# Patient Record
Sex: Male | Born: 1955 | Race: White | Hispanic: No | Smoking: Former smoker
Health system: Southern US, Community
[De-identification: ages and names within clinical notes are randomized; demographics above are authoritative.]

## PROBLEM LIST (undated history)

## (undated) DIAGNOSIS — G5602 Carpal tunnel syndrome, left upper limb: Secondary | ICD-10-CM

## (undated) DIAGNOSIS — E119 Type 2 diabetes mellitus without complications: Secondary | ICD-10-CM

## (undated) DIAGNOSIS — M67912 Unspecified disorder of synovium and tendon, left shoulder: Secondary | ICD-10-CM

## (undated) DIAGNOSIS — I255 Ischemic cardiomyopathy: Secondary | ICD-10-CM

## (undated) DIAGNOSIS — E669 Obesity, unspecified: Secondary | ICD-10-CM

## (undated) DIAGNOSIS — I2510 Atherosclerotic heart disease of native coronary artery without angina pectoris: Secondary | ICD-10-CM

## (undated) DIAGNOSIS — I252 Old myocardial infarction: Secondary | ICD-10-CM

## (undated) DIAGNOSIS — Z79899 Other long term (current) drug therapy: Secondary | ICD-10-CM

## (undated) DIAGNOSIS — M47812 Spondylosis without myelopathy or radiculopathy, cervical region: Secondary | ICD-10-CM

## (undated) DIAGNOSIS — Z9862 Peripheral vascular angioplasty status: Secondary | ICD-10-CM

## (undated) DIAGNOSIS — J439 Emphysema, unspecified: Secondary | ICD-10-CM

## (undated) DIAGNOSIS — K259 Gastric ulcer, unspecified as acute or chronic, without hemorrhage or perforation: Secondary | ICD-10-CM

## (undated) DIAGNOSIS — I10 Essential (primary) hypertension: Secondary | ICD-10-CM

## (undated) DIAGNOSIS — E1142 Type 2 diabetes mellitus with diabetic polyneuropathy: Secondary | ICD-10-CM

## (undated) DIAGNOSIS — Z7982 Long term (current) use of aspirin: Secondary | ICD-10-CM

## (undated) DIAGNOSIS — E782 Mixed hyperlipidemia: Secondary | ICD-10-CM

## (undated) DIAGNOSIS — G4730 Sleep apnea, unspecified: Secondary | ICD-10-CM

## (undated) DIAGNOSIS — E1340 Other specified diabetes mellitus with diabetic neuropathy, unspecified: Secondary | ICD-10-CM

## (undated) DIAGNOSIS — I509 Heart failure, unspecified: Secondary | ICD-10-CM

## (undated) DIAGNOSIS — Z955 Presence of coronary angioplasty implant and graft: Secondary | ICD-10-CM

## (undated) DIAGNOSIS — E13319 Other specified diabetes mellitus with unspecified diabetic retinopathy without macular edema: Secondary | ICD-10-CM

## (undated) DIAGNOSIS — G4733 Obstructive sleep apnea (adult) (pediatric): Secondary | ICD-10-CM

## (undated) DIAGNOSIS — G5600 Carpal tunnel syndrome, unspecified upper limb: Secondary | ICD-10-CM

## (undated) DIAGNOSIS — I700 Atherosclerosis of aorta: Secondary | ICD-10-CM

## (undated) DIAGNOSIS — M7532 Calcific tendinitis of left shoulder: Secondary | ICD-10-CM

## (undated) DIAGNOSIS — J342 Deviated nasal septum: Secondary | ICD-10-CM

## (undated) DIAGNOSIS — K219 Gastro-esophageal reflux disease without esophagitis: Secondary | ICD-10-CM

## (undated) DIAGNOSIS — E7800 Pure hypercholesterolemia, unspecified: Secondary | ICD-10-CM

## (undated) DIAGNOSIS — E785 Hyperlipidemia, unspecified: Secondary | ICD-10-CM

## (undated) DIAGNOSIS — I6789 Other cerebrovascular disease: Secondary | ICD-10-CM

## (undated) DIAGNOSIS — M1990 Unspecified osteoarthritis, unspecified site: Secondary | ICD-10-CM

## (undated) HISTORY — PX: CORONARY ANGIOPLASTY: SHX604

## (undated) HISTORY — PX: CARDIAC SURGERY: SHX584

## (undated) HISTORY — PX: MULTIPLE TOOTH EXTRACTIONS: SHX2053

---

## 1998-07-27 ENCOUNTER — Ambulatory Visit: Admission: RE | Admit: 1998-07-27 | Discharge: 1998-07-27 | Payer: Self-pay | Admitting: Pulmonary Disease

## 2000-12-28 DIAGNOSIS — I2510 Atherosclerotic heart disease of native coronary artery without angina pectoris: Secondary | ICD-10-CM

## 2000-12-28 DIAGNOSIS — I219 Acute myocardial infarction, unspecified: Secondary | ICD-10-CM

## 2000-12-28 HISTORY — DX: Atherosclerotic heart disease of native coronary artery without angina pectoris: I25.10

## 2000-12-28 HISTORY — PX: CORONARY ANGIOPLASTY WITH STENT PLACEMENT: SHX49

## 2000-12-28 HISTORY — DX: Acute myocardial infarction, unspecified: I21.9

## 2001-03-15 ENCOUNTER — Inpatient Hospital Stay (HOSPITAL_COMMUNITY): Admission: AD | Admit: 2001-03-15 | Discharge: 2001-03-17 | Payer: Self-pay | Admitting: Cardiology

## 2001-08-12 ENCOUNTER — Inpatient Hospital Stay (HOSPITAL_COMMUNITY): Admission: AD | Admit: 2001-08-12 | Discharge: 2001-08-16 | Payer: Self-pay | Admitting: Cardiology

## 2001-08-14 ENCOUNTER — Encounter: Payer: Self-pay | Admitting: Cardiology

## 2013-03-13 ENCOUNTER — Encounter: Payer: Self-pay | Admitting: Pulmonary Disease

## 2015-11-28 DIAGNOSIS — Z955 Presence of coronary angioplasty implant and graft: Secondary | ICD-10-CM

## 2015-11-28 HISTORY — DX: Presence of coronary angioplasty implant and graft: Z95.5

## 2015-12-05 ENCOUNTER — Emergency Department: Payer: Self-pay

## 2015-12-05 ENCOUNTER — Inpatient Hospital Stay
Admission: EM | Admit: 2015-12-05 | Discharge: 2015-12-07 | DRG: 247 | Disposition: A | Discharge status: Home / Self Care | Payer: Self-pay | Attending: Internal Medicine | Admitting: Internal Medicine

## 2015-12-05 ENCOUNTER — Inpatient Hospital Stay
Admit: 2015-12-05 | Discharge: 2015-12-05 | Disposition: A | Discharge status: Home / Self Care | Payer: Self-pay | Attending: Internal Medicine | Admitting: Internal Medicine

## 2015-12-05 DIAGNOSIS — Z6835 Body mass index (BMI) 35.0-35.9, adult: Secondary | ICD-10-CM

## 2015-12-05 DIAGNOSIS — I214 Non-ST elevation (NSTEMI) myocardial infarction: Secondary | ICD-10-CM

## 2015-12-05 DIAGNOSIS — I252 Old myocardial infarction: Secondary | ICD-10-CM

## 2015-12-05 DIAGNOSIS — E785 Hyperlipidemia, unspecified: Secondary | ICD-10-CM | POA: Diagnosis present

## 2015-12-05 DIAGNOSIS — I200 Unstable angina: Secondary | ICD-10-CM | POA: Diagnosis present

## 2015-12-05 DIAGNOSIS — Z87891 Personal history of nicotine dependence: Secondary | ICD-10-CM

## 2015-12-05 DIAGNOSIS — T82855A Stenosis of coronary artery stent, initial encounter: Secondary | ICD-10-CM | POA: Diagnosis present

## 2015-12-05 DIAGNOSIS — E7800 Pure hypercholesterolemia, unspecified: Secondary | ICD-10-CM | POA: Diagnosis present

## 2015-12-05 DIAGNOSIS — I25110 Atherosclerotic heart disease of native coronary artery with unstable angina pectoris: Secondary | ICD-10-CM | POA: Diagnosis present

## 2015-12-05 DIAGNOSIS — Z79899 Other long term (current) drug therapy: Secondary | ICD-10-CM

## 2015-12-05 DIAGNOSIS — Z7984 Long term (current) use of oral hypoglycemic drugs: Secondary | ICD-10-CM

## 2015-12-05 DIAGNOSIS — Z7982 Long term (current) use of aspirin: Secondary | ICD-10-CM

## 2015-12-05 DIAGNOSIS — I10 Essential (primary) hypertension: Secondary | ICD-10-CM | POA: Diagnosis present

## 2015-12-05 DIAGNOSIS — E119 Type 2 diabetes mellitus without complications: Secondary | ICD-10-CM | POA: Diagnosis present

## 2015-12-05 DIAGNOSIS — Z888 Allergy status to other drugs, medicaments and biological substances status: Secondary | ICD-10-CM

## 2015-12-05 DIAGNOSIS — Y848 Other medical procedures as the cause of abnormal reaction of the patient, or of later complication, without mention of misadventure at the time of the procedure: Secondary | ICD-10-CM | POA: Diagnosis present

## 2015-12-05 HISTORY — DX: Old myocardial infarction: I25.2

## 2015-12-05 HISTORY — DX: Type 2 diabetes mellitus without complications: E11.9

## 2015-12-05 HISTORY — DX: Pure hypercholesterolemia, unspecified: E78.00

## 2015-12-05 HISTORY — DX: Essential (primary) hypertension: I10

## 2015-12-05 HISTORY — DX: Presence of coronary angioplasty implant and graft: Z95.5

## 2015-12-05 HISTORY — DX: Non-ST elevation (NSTEMI) myocardial infarction: I21.4

## 2015-12-05 LAB — CBC
HEMATOCRIT: 43.7 % (ref 40.0–52.0)
HEMOGLOBIN: 14.5 g/dL (ref 13.0–18.0)
MCH: 27.3 pg (ref 26.0–34.0)
MCHC: 33.3 g/dL (ref 32.0–36.0)
MCV: 82.1 fL (ref 80.0–100.0)
Platelets: 215 10*3/uL (ref 150–440)
RBC: 5.33 MIL/uL (ref 4.40–5.90)
RDW: 13.5 % (ref 11.5–14.5)
WBC: 5.2 10*3/uL (ref 3.8–10.6)

## 2015-12-05 LAB — BASIC METABOLIC PANEL
ANION GAP: 7 (ref 5–15)
BUN: 17 mg/dL (ref 6–20)
CHLORIDE: 104 mmol/L (ref 101–111)
CO2: 27 mmol/L (ref 22–32)
Calcium: 9.4 mg/dL (ref 8.9–10.3)
Creatinine, Ser: 1 mg/dL (ref 0.61–1.24)
GFR calc Af Amer: >60 mL/min (ref >60)
GLUCOSE: 229 mg/dL — AB (ref 65–99)
POTASSIUM: 4 mmol/L (ref 3.5–5.1)
Sodium: 138 mmol/L (ref 135–145)

## 2015-12-05 LAB — TROPONIN I
TROPONIN I: 0.28 ng/mL — AB (ref <0.031)
TROPONIN I: 0.78 ng/mL — AB (ref <0.031)
TROPONIN I: 1.01 ng/mL — AB (ref <0.031)
Troponin I: 0.06 ng/mL — ABNORMAL HIGH (ref <0.031)

## 2015-12-05 LAB — GLUCOSE, CAPILLARY
GLUCOSE-CAPILLARY: 177 mg/dL — AB (ref 65–99)
Glucose-Capillary: 182 mg/dL — ABNORMAL HIGH (ref 65–99)
Glucose-Capillary: 265 mg/dL — ABNORMAL HIGH (ref 65–99)

## 2015-12-05 MED ORDER — VITAMIN C 500 MG PO TABS
1000.0000 mg | ORAL_TABLET | Freq: Every day | ORAL | Status: DC
  Administered 2015-12-05 – 2015-12-07 (×3): 1000 mg via ORAL
  Filled 2015-12-05 (×3): qty 2

## 2015-12-05 MED ORDER — ACETAMINOPHEN 325 MG PO TABS
650.0000 mg | ORAL_TABLET | Freq: Four times a day (QID) | ORAL | Status: DC | PRN
  Administered 2015-12-06: 650 mg via ORAL
  Filled 2015-12-05: qty 2

## 2015-12-05 MED ORDER — MORPHINE SULFATE (PF) 2 MG/ML IV SOLN: 1.0000 mg | INTRAVENOUS | Status: DC | PRN

## 2015-12-05 MED ORDER — ENOXAPARIN SODIUM 150 MG/ML ~~LOC~~ SOLN
1.0000 mg/kg | Freq: Two times a day (BID) | SUBCUTANEOUS | Status: DC
Start: 2015-12-05 — End: 2015-12-05
  Filled 2015-12-05 (×2): qty 0.85

## 2015-12-05 MED ORDER — ENALAPRIL MALEATE 10 MG PO TABS
10.0000 mg | ORAL_TABLET | Freq: Every day | ORAL | Status: DC
  Administered 2015-12-05 – 2015-12-07 (×3): 10 mg via ORAL
  Filled 2015-12-05 (×3): qty 1

## 2015-12-05 MED ORDER — SODIUM CHLORIDE 0.9 % WEIGHT BASED INFUSION: 3.0000 mL/kg/h | INTRAVENOUS | Status: AC

## 2015-12-05 MED ORDER — SODIUM CHLORIDE 0.9 % WEIGHT BASED INFUSION
1.0000 mL/kg/h | INTRAVENOUS | Status: DC
  Administered 2015-12-06: 1 mL/kg/h via INTRAVENOUS

## 2015-12-05 MED ORDER — VITAMIN B-12 1000 MCG PO TABS
1000.0000 ug | ORAL_TABLET | Freq: Every day | ORAL | Status: DC
  Administered 2015-12-05 – 2015-12-07 (×3): 1000 ug via ORAL
  Filled 2015-12-05 (×3): qty 1

## 2015-12-05 MED ORDER — ONDANSETRON HCL 4 MG PO TABS: 4.0000 mg | ORAL_TABLET | Freq: Four times a day (QID) | ORAL | Status: DC | PRN

## 2015-12-05 MED ORDER — ASPIRIN EC 81 MG PO TBEC
81.0000 mg | DELAYED_RELEASE_TABLET | Freq: Every day | ORAL | Status: DC
  Administered 2015-12-05: 81 mg via ORAL
  Filled 2015-12-05: qty 1

## 2015-12-05 MED ORDER — OXYCODONE HCL 5 MG PO TABS: 5.0000 mg | ORAL_TABLET | ORAL | Status: DC | PRN

## 2015-12-05 MED ORDER — ACETAMINOPHEN 650 MG RE SUPP: 650.0000 mg | Freq: Four times a day (QID) | RECTAL | Status: DC | PRN

## 2015-12-05 MED ORDER — ENOXAPARIN SODIUM 150 MG/ML ~~LOC~~ SOLN
1.0000 mg/kg | Freq: Once | SUBCUTANEOUS | Status: AC
  Administered 2015-12-05: 130 mg via SUBCUTANEOUS
  Filled 2015-12-05: qty 0.85

## 2015-12-05 MED ORDER — SODIUM CHLORIDE 0.9 % IJ SOLN: 3.0000 mL | INTRAMUSCULAR | Status: DC | PRN

## 2015-12-05 MED ORDER — SODIUM CHLORIDE 0.9 % IV SOLN
INTRAVENOUS | Status: DC
  Administered 2015-12-05 – 2015-12-07 (×5): via INTRAVENOUS

## 2015-12-05 MED ORDER — VITAMIN E 180 MG (400 UNIT) PO CAPS
800.0000 [IU] | ORAL_CAPSULE | Freq: Every day | ORAL | Status: DC
  Administered 2015-12-05 – 2015-12-07 (×3): 800 [IU] via ORAL
  Filled 2015-12-05 (×3): qty 2

## 2015-12-05 MED ORDER — OMEGA-3-ACID ETHYL ESTERS 1 G PO CAPS
2.0000 g | ORAL_CAPSULE | Freq: Every day | ORAL | Status: DC
  Administered 2015-12-05 – 2015-12-07 (×3): 2 g via ORAL
  Filled 2015-12-05 (×3): qty 2

## 2015-12-05 MED ORDER — INSULIN ASPART 100 UNIT/ML ~~LOC~~ SOLN: 0.0000 [IU] | Freq: Every day | SUBCUTANEOUS | Status: DC

## 2015-12-05 MED ORDER — SODIUM CHLORIDE 0.9 % IV SOLN: 250.0000 mL | INTRAVENOUS | Status: DC | PRN

## 2015-12-05 MED ORDER — ASPIRIN 81 MG PO CHEW
81.0000 mg | CHEWABLE_TABLET | ORAL | Status: AC
  Administered 2015-12-06: 81 mg via ORAL
  Filled 2015-12-05: qty 1

## 2015-12-05 MED ORDER — ROSUVASTATIN CALCIUM 10 MG PO TABS
10.0000 mg | ORAL_TABLET | Freq: Every day | ORAL | Status: DC
  Administered 2015-12-05 – 2015-12-06 (×2): 10 mg via ORAL
  Filled 2015-12-05 (×3): qty 1

## 2015-12-05 MED ORDER — SODIUM CHLORIDE 0.9 % IJ SOLN: 3.0000 mL | Freq: Two times a day (BID) | INTRAMUSCULAR | Status: DC

## 2015-12-05 MED ORDER — INSULIN ASPART 100 UNIT/ML ~~LOC~~ SOLN
0.0000 [IU] | Freq: Three times a day (TID) | SUBCUTANEOUS | Status: DC
  Administered 2015-12-05: 8 [IU] via SUBCUTANEOUS
  Administered 2015-12-05: 3 [IU] via SUBCUTANEOUS
  Filled 2015-12-05: qty 3
  Filled 2015-12-05: qty 8

## 2015-12-05 MED ORDER — ENOXAPARIN SODIUM 120 MG/0.8ML ~~LOC~~ SOLN
1.0000 mg/kg | Freq: Two times a day (BID) | SUBCUTANEOUS | Status: DC
  Administered 2015-12-05 (×2): 120 mg via SUBCUTANEOUS
  Filled 2015-12-05 (×4): qty 0.8

## 2015-12-05 MED ORDER — INSULIN ASPART 100 UNIT/ML ~~LOC~~ SOLN
0.0000 [IU] | Freq: Three times a day (TID) | SUBCUTANEOUS | Status: DC
  Administered 2015-12-05: 2 [IU] via SUBCUTANEOUS
  Administered 2015-12-06: 3 [IU] via SUBCUTANEOUS
  Administered 2015-12-06: 5 [IU] via SUBCUTANEOUS
  Administered 2015-12-07: 3 [IU] via SUBCUTANEOUS
  Filled 2015-12-05: qty 3
  Filled 2015-12-05: qty 2
  Filled 2015-12-05: qty 3
  Filled 2015-12-05: qty 5
  Filled 2015-12-05: qty 3

## 2015-12-05 MED ORDER — ONDANSETRON HCL 4 MG/2ML IJ SOLN: 4.0000 mg | Freq: Four times a day (QID) | INTRAMUSCULAR | Status: DC | PRN

## 2015-12-05 MED ORDER — ATENOLOL 25 MG PO TABS
50.0000 mg | ORAL_TABLET | Freq: Every day | ORAL | Status: DC
  Administered 2015-12-05 – 2015-12-07 (×3): 50 mg via ORAL
  Filled 2015-12-05 (×3): qty 2

## 2015-12-05 MED ORDER — ADULT MULTIVITAMIN W/MINERALS CH
1.0000 | ORAL_TABLET | Freq: Every day | ORAL | Status: DC
  Administered 2015-12-05 – 2015-12-07 (×3): 1 via ORAL
  Filled 2015-12-05 (×3): qty 1

## 2015-12-05 NOTE — Consult Note (Signed)
[facility] Cardiology  CARDIOLOGY CONSULT NOTE  Patient ID: Marc Blair MRN: [id number] DOB/AGE: 61/04/1956 61 y.o.  Admit date: 12/05/2015 Referring Physician Allena KatzPatel Primary Physician  Primary Cardiologist  Reason for Consultation unstable angina  HPI: The patient is a 59 year old gentleman with known coronary artery disease referred for evaluation of ST pain. Patient is status post [facility]. Patient has been likely stable on Toprol this morning when [facility] with prolonged episode of chest pain and elevated blood pressure. Initial EKG was nondiagnostic. She was treated with aspirin and Nitropaste resolution of chest pain. Initial troponin was borderline elevated 0.06. Patient had no recurrent chest pain following admission to telemetry floor.  Review of systems complete and found to be negative unless listed above     Past Medical History  Diagnosis Date  . Hypertension   . Diabetes mellitus without complication (HCC)   . High cholesterol   . MI, old     2002    Past Surgical History  Procedure Laterality Date  . Cardiac surgery      Prescriptions prior to admission  Medication Sig Dispense Refill Last Dose  . ascorbic acid (VITAMIN C) 1000 MG tablet Take 1,000 mg by mouth daily.   12/04/2015 at Unknown time  . aspirin EC 81 MG tablet Take 81 mg by mouth daily.   12/05/2015 at Unknown time  . atenolol (TENORMIN) 50 MG tablet Take 50 mg by mouth daily.   12/04/2015 at Unknown time  . Bee Pollen 1000 MG TABS Take 1,000 mg by mouth daily.   12/04/2015 at Unknown time  . enalapril (VASOTEC) 10 MG tablet Take 10 mg by mouth daily.   12/04/2015 at Unknown time  . metFORMIN (GLUCOPHAGE) 1000 MG tablet Take 1,000 mg by mouth 2 (two) times daily with a meal.   12/04/2015 at Unknown time  . Multiple Vitamin (MULTIVITAMIN WITH MINERALS) TABS tablet Take 1 tablet by mouth daily.   12/04/2015 at Unknown time  . Omega-3 Fatty Acids (FISH OIL)  1000 MG CAPS Take 2,000 mg by mouth daily.   12/04/2015 at Unknown time  . rosuvastatin (CRESTOR) 10 MG tablet Take 10 mg by mouth daily.   12/04/2015 at Unknown time  . rosuvastatin (CRESTOR) 20 MG tablet Take 20 mg by mouth daily.   12/04/2015 at Unknown time  . sitaGLIPtin (JANUVIA) 100 MG tablet Take 100 mg by mouth daily.   12/04/2015 at Unknown time  . vitamin B-12 (CYANOCOBALAMIN) 1000 MCG tablet Take 1,000 mcg by mouth daily.   12/04/2015 at Unknown time  . vitamin E (VITAMIN E) 400 UNIT capsule Take 800 Units by mouth daily.   12/04/2015 at Unknown time   Social History   Social History  . Marital Status: Married    Spouse Name: N/A  . Number of Children: N/A  . Years of Education: N/A   Occupational History  . Not on file.   Social History Main Topics  . Smoking status: Former Smoker    Quit date: 04/05/2015  . Smokeless tobacco: Not on file  . Alcohol Use: Not on file  . Drug Use: Not on file  . Sexual Activity: Not on file   Other Topics Concern  . Not on file   Social History Narrative  . No narrative on file    History reviewed. No pertinent family history.    Review of systems complete and found to be negative unless listed above  PHYSICAL EXAM  General: Well developed, well nourished, in no acute distress HEENT:  Normocephalic and atramatic Neck:  No JVD.  Lungs: Clear bilaterally to auscultation and percussion. Heart: HRRR . Normal S1 and S2 without gallops or murmurs.  Abdomen: Bowel sounds are positive, abdomen soft and non-tender  Msk:  Back normal, normal gait. Normal strength and tone for age. Extremities: No clubbing, cyanosis or edema.   Neuro: Alert and oriented X 3. Psych:  Good affect, responds appropriately  Labs:   Lab Results  Component Value Date   WBC 5.2 12/05/2015   HGB 14.5 12/05/2015   HCT 43.7 12/05/2015   MCV 82.1 12/05/2015   PLT 215 12/05/2015    Recent Labs Lab 12/05/15 0251  NA 138  K 4.0  CL 104  CO2 27   BUN 17  CREATININE 1.00  CALCIUM 9.4  GLUCOSE 229*   Lab Results  Component Value Date   TROPONINI 0.28* 12/05/2015   No results found for: CHOL No results found for: HDL No results found for: LDLCALC No results found for: TRIG No results found for: CHOLHDL No results found for: LDLDIRECT    Radiology: Dg Chest Port 1 View  12/05/2015  CLINICAL DATA:  Acute onset of mid chest pain, radiating to the left, and down the left arm. Initial encounter. EXAM: PORTABLE CHEST 1 VIEW COMPARISON:  None. FINDINGS: The lungs are well-aerated. Minimal bibasilar atelectasis is noted. There is no evidence of pleural effusion or pneumothorax. The cardiomediastinal silhouette is borderline normal in size. No acute osseous abnormalities are seen. There is an accessory articulation between the right second and third ribs. IMPRESSION: Minimal bibasilar atelectasis noted.  Lungs otherwise clear. Electronically Signed   By: Roanna Raider M.D.   On: 12/05/2015 03:35    EKG: Normal sinus rhythm  ASSESSMENT AND PLAN:   1. Unstable angina versus non-ST elevation myocardial infarction. The patient has known coronary artery disease status post prior coronary stents, currently chest pain-free  Recommendations  1. Agree with current therapy 2. Cardiac catheterization with selective coronary arteriography scheduled for 11/06/2015. The risks, benefits alternatives to cardiac catheterization were explained to the patient and informed written consent was obtained.  SignedMarcina Millard MD,PhD, [facility] 12/05/2015, 9:29 AM

## 2015-12-05 NOTE — ED Notes (Addendum)
Pt bib EMS w/ c/o CP that started tonight.  Pt sts pain feels like pressure in L chest that radiates to L arm. Pt denies n/v/d, LOC, dizziness. Pt sts he is SOB.  Pt A/Ox4.  Pt took 3 ASA at home this AM.  Per EMS PT received 324 mg ASA via EMS and 1" nitro paste.

## 2015-12-05 NOTE — Progress Notes (Signed)
[facility] [place] [facility]   PATIENT NAME: Marc Blair    MR#:  [id number]  DATE OF BIRTH:  12/31/1955  SUBJECTIVE:  CHIEF COMPLAINT:  Patient is reporting  dull chest discomfort on the left side of the chest radiating to the shoulder. Denies any shortness of breath. No other complaints, sister at bedside  REVIEW OF SYSTEMS:  CONSTITUTIONAL: No fever, fatigue or weakness.  EYES: No blurred or double vision.  EARS, NOSE, AND THROAT: No tinnitus or ear pain.  RESPIRATORY: No cough, shortness of breath, wheezing or hemoptysis.  CARDIOVASCULAR: Reporting dull chest pain on the left side of the chest, denies orthopnea, edema.  GASTROINTESTINAL: No nausea, vomiting, diarrhea or abdominal pain.  GENITOURINARY: No dysuria, hematuria.  ENDOCRINE: No polyuria, nocturia,  HEMATOLOGY: No anemia, easy bruising or bleeding SKIN: No rash or lesion. MUSCULOSKELETAL: No joint pain or arthritis.   NEUROLOGIC: No tingling, numbness, weakness.  PSYCHIATRY: No anxiety or depression.   DRUG ALLERGIES:   Allergies  Allergen Reactions  . Lipitor [Atorvastatin] Other (See Comments)    "FEELS LIKE SOMEONE BEAT THE DAYLIGHTS OUT OF ME"    VITALS:  Blood pressure 128/80, pulse 64, temperature 97.7 F (36.5 C), temperature source Oral, resp. rate 16, height 6' (1.829 m), weight 119.251 kg (262 lb 14.4 oz), SpO2 95 %.  PHYSICAL EXAMINATION:  GENERAL:  59 y.o.-year-old patient lying in the bed with no acute distress.  EYES: Pupils equal, round, reactive to light and accommodation. No scleral icterus. Extraocular muscles intact.  HEENT: Head atraumatic, normocephalic. Oropharynx and nasopharynx clear.  NECK:  Supple, no jugular venous distention. No thyroid enlargement, no tenderness.  LUNGS: Normal breath sounds bilaterally, no wheezing, rales,rhonchi or crepitation. No use of accessory muscles of respiration.  CARDIOVASCULAR: S1, S2 normal. No murmurs, rubs, or  gallops. No reproducible anterior chest wall tenderness ABDOMEN: Soft, nontender, nondistended. Bowel sounds present. No organomegaly or mass.  EXTREMITIES: No pedal edema, cyanosis, or clubbing.  NEUROLOGIC: Cranial nerves II through XII are intact. Muscle strength 5/5 in all extremities. Sensation intact. Gait not checked.  PSYCHIATRIC: The patient is alert and oriented x 3.  SKIN: No obvious rash, lesion, or ulcer.    LABORATORY PANEL:   CBC  Recent Labs Lab 12/05/15 0251  WBC 5.2  HGB 14.5  HCT 43.7  PLT 215   ------------------------------------------------------------------------------------------------------------------  Chemistries   Recent Labs Lab 12/05/15 0251  NA 138  K 4.0  CL 104  CO2 27  GLUCOSE 229*  BUN 17  CREATININE 1.00  CALCIUM 9.4   ------------------------------------------------------------------------------------------------------------------  Cardiac Enzymes  Recent Labs Lab 12/05/15 1212  TROPONINI 0.78*   ------------------------------------------------------------------------------------------------------------------  RADIOLOGY:  Dg Chest Port 1 View  12/05/2015  CLINICAL DATA:  Acute onset of mid chest pain, radiating to the left, and down the left arm. Initial encounter. EXAM: PORTABLE CHEST 1 VIEW COMPARISON:  None. FINDINGS: The lungs are well-aerated. Minimal bibasilar atelectasis is noted. There is no evidence of pleural effusion or pneumothorax. The cardiomediastinal silhouette is borderline normal in size. No acute osseous abnormalities are seen. There is an accessory articulation between the right second and third ribs. IMPRESSION: Minimal bibasilar atelectasis noted.  Lungs otherwise clear. Electronically Signed   By: Roanna Raider M.D.   On: 12/05/2015 03:35    EKG:   Orders placed or performed during the hospital encounter of 12/05/15  . ED EKG  . ED EKG    ASSESSMENT AND PLAN:   [name] is  a 59 y.o. male  with a known history of CAD status post stent 2 in 2002, diabetes, hypertension, morbid obesity and hyperlipidemia comes to the emergency room after patient got home from work at midnight had a snack to eat thereafter he started having symptoms of indigestion feeling along with bilateral shoulder pain more so on the left radiating to the left arm.  1. Non-STEMI in the setting of coronary artery disease status post stenting 2 in 2002 with multiple risk factors of morbid obesity hypertension and diabetes Troponins are 0.06-0. 28-0.78 Patient is getting Lovenox 1 mg/kg subcutaneous twice a day Scheduled to get cardiac catheter in a.m. Will keep her nothing by mouth after midnight Appreciate cardiology recommendations Continue aspirin, nitroglycerin, beta blockers, ace inhibitors and statins  2. Type 2 diabetes Continue home meds except metformin since patient may require cardiac catheterization Sliding-scale insulin  3. Hypertension Continue Ace inhibitors and beta blockers  4. Hyperlipidemia on statins  5. DVT prophylaxis already on Lovenox treatment dose.     All the records are reviewed and case discussed with Care Management/Social Workerr. Management plans discussed with the patient, family and they are in agreement.  CODE STATUS: Full code  TOTAL CRITICAL CARE TIME TAKING CARE OF THIS PATIENT: 35 minutes.   POSSIBLE D/C IN 1-2DAYS, DEPENDING ON CLINICAL CONDITION.   Ramonita LabGouru, Lorraine Cimmino M.D on 12/05/2015 at 2:53 PM  Between 7am to 6pm - Pager - [phone] After 6pm go to [email or web address] - password [email or web address] [facility]  [facility]  Office  [phone]  CC: Primary care physician; No primary care provider on file.

## 2015-12-05 NOTE — Plan of Care (Signed)
Problem: Pain Managment: Goal: General experience of comfort will improve Outcome: Progressing Prn meds  Problem: Tissue Perfusion: Goal: Risk factors for ineffective tissue perfusion will decrease Outcome: Progressing SQ Lovenox     Problem: Consults Goal: Diabetes Guidelines if Diabetic/Glucose > 140 If diabetic or lab glucose is > 140 mg/dl - Initiate Diabetes/Hyperglycemia Guidelines & Document Interventions  Outcome: Progressing SSI in place

## 2015-12-05 NOTE — Progress Notes (Signed)
Lovenox dose note:  59 yo male on Lovenox 1 mg/kg(130 mg) q12h for unstable angina,CAD, stents, cardiology consult.  Initial weight= 128 kg Current weight 12/8 at 0613= 119 kg  Will adjust Lovenox dosing to Lovenox 1mg /kg (120 mg) Q12h due to change in weight.  Bari MantisKristin Sahid Borba PharmD Clinical Pharmacist 12/05/2015 8:14 AM

## 2015-12-05 NOTE — Progress Notes (Signed)
Marc Blair notified of patient's fourth Troponin being elevated/ trending up @ 1.01, patient on Lovenox and scheduled for cardiac cath tomorrow. MD stated she would consult with pharmacy about adjusting anticoag medications. Awaiting to see if changes will be made. MD to enter orders, if adjusting.

## 2015-12-05 NOTE — Progress Notes (Signed)
*  PRELIMINARY RESULTS* Echocardiogram 2D Echocardiogram has been performed.  Garrel Ridgelikeshia S Stills 12/05/2015, 4:48 PM

## 2015-12-05 NOTE — H&P (Signed)
[facility]   PATIENT NAME: Marc Blair    MR#:  [id number]  DATE OF BIRTH:  July 10, 1956  DATE OF ADMISSION:  12/05/2015  PRIMARY CARE PHYSICIAN: No primary care provider on file.   REQUESTING/REFERRING PHYSICIAN: Dr. Manson Passey  CHIEF COMPLAINT:  Bilateral shoulder pain and chest pain radiating to the left arm  HISTORY OF PRESENT ILLNESS:  Marc Blair  is a 59 y.o. male with a known history of CAD status post stent 2 in 2002, diabetes, hypertension, morbid obesity and hyperlipidemia comes to the emergency room after patient got home from work at midnight had a snack to eat thereafter he started having symptoms of indigestion feeling along with bilateral shoulder pain more so on the left radiating to the left arm. Patient took some aspirin but very restless not able to get comfortable and drove himself to the emergency room. His blood pressure was systolic 177. Received another aspirin and nitro paste currently he is chest pain-free. His troponin is 0.06. His EKG shows normal sinus rhythm with T-wave changes in inferior leads. Drawn have ordered EKG to compare to. Patient is being admitted with unstable angina. He also received Lovenox 1 mg/kg.  PAST MEDICAL HISTORY:   Past Medical History  Diagnosis Date  . Hypertension   . Diabetes mellitus without complication (HCC)   . High cholesterol   . MI, old     2002    PAST SURGICAL HISTOIRY:   Past Surgical History  Procedure Laterality Date  . Cardiac surgery      SOCIAL HISTORY:   Social History  Substance Use Topics  . Smoking status: Former Games developer  . Smokeless tobacco: Not on file  . Alcohol Use: Not on file    FAMILY HISTORY:  No family history on file.  DRUG ALLERGIES:   Allergies  Allergen Reactions  . Lipitor [Atorvastatin] Other (See Comments)    "FEELS LIKE SOMEONE BEAT THE DAYLIGHTS OUT OF ME"    REVIEW OF SYSTEMS:  Review of Systems  Constitutional:  Negative for fever, chills and weight loss.  HENT: Negative for ear discharge, ear pain and nosebleeds.   Eyes: Negative for blurred vision, pain and discharge.  Respiratory: Negative for sputum production, shortness of breath, wheezing and stridor.   Cardiovascular: Positive for chest pain. Negative for palpitations, orthopnea and PND.  Gastrointestinal: Negative for nausea, vomiting, abdominal pain and diarrhea.  Genitourinary: Negative for urgency and frequency.  Musculoskeletal: Positive for joint pain. Negative for back pain.  Neurological: Negative for sensory change, speech change, focal weakness and weakness.  Psychiatric/Behavioral: Negative for depression and hallucinations. The patient is not nervous/anxious.   All other systems reviewed and are negative.    MEDICATIONS AT HOME:   Prior to Admission medications   Medication Sig Start Date End Date Taking? Authorizing Provider  ascorbic acid (VITAMIN C) 1000 MG tablet Take 1,000 mg by mouth daily.   Yes Historical Provider, MD  aspirin EC 81 MG tablet Take 81 mg by mouth daily.   Yes Historical Provider, MD  atenolol (TENORMIN) 50 MG tablet Take 50 mg by mouth daily.   Yes Historical Provider, MD  Bee Pollen 1000 MG TABS Take 1,000 mg by mouth daily.   Yes Historical Provider, MD  enalapril (VASOTEC) 10 MG tablet Take 10 mg by mouth daily.   Yes Historical Provider, MD  metFORMIN (GLUCOPHAGE) 1000 MG tablet Take 1,000 mg by mouth 2 (two) times daily with a meal.  Yes Historical Provider, MD  Multiple Vitamin (MULTIVITAMIN WITH MINERALS) TABS tablet Take 1 tablet by mouth daily.   Yes Historical Provider, MD  Omega-3 Fatty Acids (FISH OIL) 1000 MG CAPS Take 2,000 mg by mouth daily.   Yes Historical Provider, MD  rosuvastatin (CRESTOR) 10 MG tablet Take 10 mg by mouth daily.   Yes Historical Provider, MD  rosuvastatin (CRESTOR) 20 MG tablet Take 20 mg by mouth daily.   Yes Historical Provider, MD  sitaGLIPtin (JANUVIA) 100 MG  tablet Take 100 mg by mouth daily.   Yes Historical Provider, MD  vitamin B-12 (CYANOCOBALAMIN) 1000 MCG tablet Take 1,000 mcg by mouth daily.   Yes Historical Provider, MD  vitamin E (VITAMIN E) 400 UNIT capsule Take 800 Units by mouth daily.   Yes Historical Provider, MD      VITAL SIGNS:  Blood pressure 117/68, pulse 67, resp. rate 16, weight 282 lb 3 oz (128 kg), SpO2 98 %.  PHYSICAL EXAMINATION:  GENERAL:  59 y.o.-year-old patient lying in the bed with no acute distress. Morbid obesity EYES: Pupils equal, round, reactive to light and accommodation. No scleral icterus. Extraocular muscles intact.  HEENT: Head atraumatic, normocephalic. Oropharynx and nasopharynx clear.  NECK:  Supple, no jugular venous distention. No thyroid enlargement, no tenderness.  LUNGS: Normal breath sounds bilaterally, no wheezing, rales,rhonchi or crepitation. No use of accessory muscles of respiration.  CARDIOVASCULAR: S1, S2 normal. No murmurs, rubs, or gallops.  ABDOMEN: Soft, nontender, nondistended. Bowel sounds present. No organomegaly or mass.  EXTREMITIES: No pedal edema, cyanosis, or clubbing.  NEUROLOGIC: Cranial nerves II through XII are intact. Muscle strength 5/5 in all extremities. Sensation intact. Gait not checked.  PSYCHIATRIC: The patient is alert and oriented x 3.  SKIN: No obvious rash, lesion, or ulcer.   LABORATORY PANEL:   CBC  Recent Labs Lab 12/05/15 0251  WBC 5.2  HGB 14.5  HCT 43.7  PLT 215   ------------------------------------------------------------------------------------------------------------------  Chemistries   Recent Labs Lab 12/05/15 0251  NA 138  K 4.0  CL 104  CO2 27  GLUCOSE 229*  BUN 17  CREATININE 1.00  CALCIUM 9.4   ------------------------------------------------------------------------------------------------------------------  Cardiac Enzymes  Recent Labs Lab 12/05/15 0251  TROPONINI 0.06*    ------------------------------------------------------------------------------------------------------------------  RADIOLOGY:  Dg Chest Port 1 View  12/05/2015  CLINICAL DATA:  Acute onset of mid chest pain, radiating to the left, and down the left arm. Initial encounter. EXAM: PORTABLE CHEST 1 VIEW COMPARISON:  None. FINDINGS: The lungs are well-aerated. Minimal bibasilar atelectasis is noted. There is no evidence of pleural effusion or pneumothorax. The cardiomediastinal silhouette is borderline normal in size. No acute osseous abnormalities are seen. There is an accessory articulation between the right second and third ribs. IMPRESSION: Minimal bibasilar atelectasis noted.  Lungs otherwise clear. Electronically Signed   By: Roanna RaiderJeffery  Chang M.D.   On: 12/05/2015 03:35    EKG:   Sinus rhythm. T-wave changes in inferior leads. No aortic EKG to compare IMPRESSION AND PLAN:  Marc HighlandMichael Blair  is a 59 y.o. male with a known history of CAD status post stent 2 in 2002, diabetes, hypertension, morbid obesity and hyperlipidemia comes to the emergency room after patient got home from work at midnight had a snack to eat thereafter he started having symptoms of indigestion feeling along with bilateral shoulder pain more so on the left radiating to the left arm.  1. Unstable angina in the setting of coronary artery disease status post stenting 2 in 2002  with multiple risk factors of morbid obesity hypertension and diabetes -Admit patient to telemetry floor -Cycle cardiac enzymes 3 -Nothing by mouth except meds and sips -Cardiology consultation in the morning. -Lovenox 1 mg/kg twice a day, aspirin, nitroglycerin, beta blockers, ace inhibitors and statins  2. Type 2 diabetes Continue home meds except metformin since patient may require cardiac Sliding-scale insulin  3. Hypertension Continue Ace inhibitors and beta blockers  4. Hyperlipidemia on statins  5. DVT prophylaxis already on Lovenox  treatment dose.   All the records are reviewed and case discussed with ED provider. Management plans discussed with the patient, family and they are in agreement.  CODE STATUS: Full  TOTAL TIME TAKING CARE OF THIS PATIENT: 45 minutes.    Marc Blair M.D on 12/05/2015 at 4:32 AM  Between 7am to 6pm - Pager - [phone]  After 6pm go to [email or web address] - password [email or web address] [facility]  [facility]  Office  [phone]  CC: Primary care physician; No primary care provider on file.   Dr. Manson Passey

## 2015-12-05 NOTE — Progress Notes (Signed)
Inpatient Diabetes Program Recommendations  AACE/ADA: New Consensus Statement on Inpatient Glycemic Control (2015)  Target Ranges:  Prepandial:   less than 140 mg/dL      Peak postprandial:   less than 180 mg/dL (1-2 hours)      Critically ill patients:  140 - 180 mg/dL  Results for Blanca FriendBAKER, Breyson B (MRN [id number]) as of 12/05/2015 12:38  Ref. Range 12/05/2015 07:27 12/05/2015 11:56  Glucose-Capillary Latest Ref Range: 65-99 mg/dL 956182 (H) 213265 (H)   Review of Glycemic Control  Diabetes history: DM2 Outpatient Diabetes medications: Metformin 1000 mg BID, Januvia 100 mg daily Current orders for Inpatient glycemic control: Novolog 0-15 units TID with meals  Inpatient Diabetes Program Recommendations: Correction (SSI): Please consider ordering Novolog bedtime correction scale. HgbA1C: A1C in process.  Thanks, Orlando PennerMarie Ruble Pumphrey, RN, MSN, CDE Diabetes Coordinator Inpatient Diabetes Program [phone] (Team Pager from 8am to 5pm) [phone] ([facility] office) [phone] [facility] office) [phone] [facility] office)

## 2015-12-05 NOTE — Progress Notes (Signed)
Blanca FriendMichael B Melamed is a 59 y.o. male patient admitted from ED awake, alert - oriented  X 4 - no acute distress noted.  VSS - Blood pressure 126/65, pulse 67, temperature 98.4 F (36.9 C), temperature source Oral, resp. rate 22, weight 119.251 kg (262 lb 14.4 oz), SpO2 98 %.    IV in place, occlusive dsg intact without redness.  Orientation to room, and floor completed with information packet given to patient/family.  Admission INP armband ID verified with patient/family, and in place.   SR up x 2, fall assessment complete, with patient and family able to verbalize understanding of risk associated with falls, and verbalized understanding to call nsg before up out of bed.  Call light within reach, patient able to voice, and demonstrate understanding.  Skin, clean-dry- intact without evidence of bruising, or skin tears.   No evidence of skin break down noted on exam. Skin assessed with Darrol JumpBrooke R., RN.     Will cont to eval and treat per MD orders.  Syliva Overmanassie A Winola Drum, RN 12/05/2015 6:53 AM

## 2015-12-05 NOTE — ED Provider Notes (Signed)
[facility] Emergency Department Provider Note  ____________________________________________  Time seen: 2:50 AM  I have reviewed the triage vital signs and the nursing notes.   HISTORY  Chief Complaint Chest Pain     HPI Marc Blair is a 59 y.o. male presents with left-sided chest pain with radiation to his left arm with onset at approximately 10 PM last night. Patient describes the pain as pressure. Patient stated that following receiving aspirin and nitroglycerin paste from EMS his chest pain improved currently 3 out of 10. Patient admits to dyspnea however denies any nausea vomiting or diaphoresis. Of note the patient has a history of MI 2 requiring 2 stent placements.     Past Medical History  Diagnosis Date  . Hypertension   . Diabetes mellitus without complication (HCC)   . High cholesterol   . MI, old     2002    There are no active problems to display for this patient.   Past Surgical History  Procedure Laterality Date  . Cardiac surgery      No current outpatient prescriptions on file.  Allergies Lipitor  No family history on file.  Social History Social History  Substance Use Topics  . Smoking status: Former Games developer  . Smokeless tobacco: None  . Alcohol Use: None    Review of Systems  Constitutional: Negative for fever. Eyes: Negative for visual changes. ENT: Negative for sore throat. Cardiovascular: Positive for chest pain. Respiratory: Positive for shortness of breath. Gastrointestinal: Negative for abdominal pain, vomiting and diarrhea. Genitourinary: Negative for dysuria. Musculoskeletal: Negative for back pain. Skin: Negative for rash. Neurological: Negative for headaches, focal weakness or numbness.   10-point ROS otherwise negative.  ____________________________________________   PHYSICAL EXAM:  VITAL SIGNS: ED Triage Vitals  Enc Vitals Group     BP 12/05/15 0251 177/80 mmHg     Pulse Rate  12/05/15 0251 73     Resp 12/05/15 0251 18     Temp --      Temp src --      SpO2 12/05/15 0251 97 %     Weight --      Height --      Head Cir --      Peak Flow --      Pain Score 12/05/15 0253 0     Pain Loc --      Pain Edu? --      Excl. in GC? --     Constitutional: Alert and oriented. Well appearing and in no distress. Eyes: Conjunctivae are normal. PERRL. Normal extraocular movements. ENT   Head: Normocephalic and atraumatic.   Nose: No congestion/rhinnorhea.   Mouth/Throat: Mucous membranes are moist.   Neck: No stridor. Hematological/Lymphatic/Immunilogical: No cervical lymphadenopathy. Cardiovascular: Normal rate, regular rhythm. Normal and symmetric distal pulses are present in all extremities. No murmurs, rubs, or gallops. Respiratory: Normal respiratory effort without tachypnea nor retractions. Breath sounds are clear and equal bilaterally. No wheezes/rales/rhonchi. Gastrointestinal: Soft and nontender. No distention. There is no CVA tenderness. Genitourinary: deferred Musculoskeletal: Nontender with normal range of motion in all extremities. No joint effusions.  No lower extremity tenderness nor edema. Neurologic:  Normal speech and language. No gross focal neurologic deficits are appreciated. Speech is normal.  Skin:  Skin is warm, dry and intact. No rash noted. Psychiatric: Mood and affect are normal. Speech and behavior are normal. Patient exhibits appropriate insight and judgment.  ____________________________________________    LABS (pertinent positives/negatives)  Labs Reviewed  BASIC  METABOLIC PANEL - Abnormal; Notable for the following:    Glucose, Bld 229 (*)    All other components within normal limits  TROPONIN I - Abnormal; Notable for the following:    Troponin I 0.06 (*)    All other components within normal limits  CBC     ____________________________________________   EKG  ED ECG REPORT I, BROWN, Frederick N, the attending  physician, personally viewed and interpreted this ECG.   Date: 12/05/2015  EKG Time: 2:49 AM  Rate: 74  Rhythm: Normal Sinus Rhythm with inverted T wave inferior leads  Axis: None  Intervals:Normal  ST&T Change: None   ____________________________________________    RADIOLOGY  DG Chest Port 1 View (Final result) Result time: 12/05/15 03:35:59   Final result by Rad Results In Interface (12/05/15 03:35:59)   Narrative:   CLINICAL DATA: Acute onset of mid chest pain, radiating to the left, and down the left arm. Initial encounter.  EXAM: PORTABLE CHEST 1 VIEW  COMPARISON: None.  FINDINGS: The lungs are well-aerated. Minimal bibasilar atelectasis is noted. There is no evidence of pleural effusion or pneumothorax.  The cardiomediastinal silhouette is borderline normal in size. No acute osseous abnormalities are seen. There is an accessory articulation between the right second and third ribs.  IMPRESSION: Minimal bibasilar atelectasis noted. Lungs otherwise clear.   Electronically Signed By: Roanna RaiderJeffery Chang M.D. On: 12/05/2015 03:35        Critical Care performed: CRITICAL CARE Performed by: Bayard MalesBROWN, Wallace N   Total critical care time: 30 minutes  Critical care time was exclusive of separately billable procedures and treating other patients.  Critical care was necessary to treat or prevent imminent or life-threatening deterioration.  Critical care was time spent personally by me on the following activities: development of treatment plan with patient and/or surrogate as well as nursing, discussions with consultants, evaluation of patient's response to treatment, examination of patient, obtaining history from patient or surrogate, ordering and performing treatments and interventions, ordering and review of laboratory studies, ordering and review of radiographic studies, pulse oximetry and re-evaluation of patient's  condition.   ____________________________________________   INITIAL IMPRESSION / ASSESSMENT AND PLAN / ED COURSE  Pertinent labs & imaging results that were available during my care of the patient were reviewed by me and considered in my medical decision making (see chart for details).  Given History and Physical exam, EKG and laboratory data findings concern for NSTEMI as such Lovenox 1mg /kg given. Patient discussed with Dr. Allena KatzPatel for Hospitalist for admission  ____________________________________________   FINAL CLINICAL IMPRESSION(S) / ED DIAGNOSES  Final diagnoses:  NSTEMI (non-ST elevated myocardial infarction) [facility](HCC)      Darci Currentandolph N Brown, MD 12/05/15 [phone]

## 2015-12-06 ENCOUNTER — Encounter
Admission: EM | Disposition: A | Discharge status: Home / Self Care | Payer: Self-pay | Source: Home / Self Care | Attending: Internal Medicine

## 2015-12-06 ENCOUNTER — Encounter: Payer: Self-pay | Admitting: Cardiology

## 2015-12-06 HISTORY — PX: CARDIAC CATHETERIZATION: SHX172

## 2015-12-06 LAB — CBC
HEMATOCRIT: 41.4 % (ref 40.0–52.0)
HEMOGLOBIN: 14.3 g/dL (ref 13.0–18.0)
MCH: 28.2 pg (ref 26.0–34.0)
MCHC: 34.4 g/dL (ref 32.0–36.0)
MCV: 81.8 fL (ref 80.0–100.0)
Platelets: 198 10*3/uL (ref 150–440)
RBC: 5.06 MIL/uL (ref 4.40–5.90)
RDW: 13.6 % (ref 11.5–14.5)
WBC: 4.3 10*3/uL (ref 3.8–10.6)

## 2015-12-06 LAB — GLUCOSE, CAPILLARY
GLUCOSE-CAPILLARY: 277 mg/dL — AB (ref 65–99)
GLUCOSE-CAPILLARY: 188 mg/dL — AB (ref 65–99)
Glucose-Capillary: 232 mg/dL — ABNORMAL HIGH (ref 65–99)

## 2015-12-06 LAB — BASIC METABOLIC PANEL
Anion gap: 4 — ABNORMAL LOW (ref 5–15)
BUN: 16 mg/dL (ref 6–20)
CHLORIDE: 106 mmol/L (ref 101–111)
CO2: 31 mmol/L (ref 22–32)
CREATININE: 1.03 mg/dL (ref 0.61–1.24)
Calcium: 8.9 mg/dL (ref 8.9–10.3)
GFR calc Af Amer: >60 mL/min (ref >60)
GFR calc non Af Amer: >60 mL/min (ref >60)
Glucose, Bld: 216 mg/dL — ABNORMAL HIGH (ref 65–99)
Potassium: 4.3 mmol/L (ref 3.5–5.1)
SODIUM: 141 mmol/L (ref 135–145)

## 2015-12-06 LAB — HEMOGLOBIN A1C: Hgb A1c MFr Bld: 8.3 % — ABNORMAL HIGH (ref 4.0–6.0)

## 2015-12-06 SURGERY — LEFT HEART CATH AND CORONARY ANGIOGRAPHY
Anesthesia: Moderate Sedation

## 2015-12-06 MED ORDER — NITROGLYCERIN 5 MG/ML IV SOLN
INTRAVENOUS | Status: AC
  Filled 2015-12-06: qty 10

## 2015-12-06 MED ORDER — INSULIN DETEMIR 100 UNIT/ML ~~LOC~~ SOLN
20.0000 [IU] | Freq: Every day | SUBCUTANEOUS | Status: DC
  Administered 2015-12-06: 20 [IU] via SUBCUTANEOUS
  Filled 2015-12-06 (×2): qty 0.2

## 2015-12-06 MED ORDER — CLOPIDOGREL BISULFATE 75 MG PO TABS
ORAL_TABLET | ORAL | Status: DC | PRN
Start: 2015-12-06 — End: 2015-12-06
  Administered 2015-12-06: 600 mg via ORAL

## 2015-12-06 MED ORDER — SODIUM CHLORIDE 0.9 % IJ SOLN: 3.0000 mL | INTRAMUSCULAR | Status: DC | PRN

## 2015-12-06 MED ORDER — BIVALIRUDIN BOLUS VIA INFUSION - CUPID
INTRAVENOUS | Status: DC | PRN
  Administered 2015-12-06: 89.475 mg via INTRAVENOUS

## 2015-12-06 MED ORDER — SODIUM CHLORIDE 0.9 % WEIGHT BASED INFUSION: 3.0000 mL/kg/h | INTRAVENOUS | Status: AC

## 2015-12-06 MED ORDER — MIDAZOLAM HCL 2 MG/2ML IJ SOLN
INTRAMUSCULAR | Status: AC
  Filled 2015-12-06: qty 2

## 2015-12-06 MED ORDER — FENTANYL CITRATE (PF) 100 MCG/2ML IJ SOLN
INTRAMUSCULAR | Status: DC | PRN
  Administered 2015-12-06 (×2): 50 ug via INTRAVENOUS

## 2015-12-06 MED ORDER — FENTANYL CITRATE (PF) 100 MCG/2ML IJ SOLN
INTRAMUSCULAR | Status: AC
  Filled 2015-12-06: qty 2

## 2015-12-06 MED ORDER — HEPARIN (PORCINE) IN NACL 2-0.9 UNIT/ML-% IJ SOLN
INTRAMUSCULAR | Status: AC
  Filled 2015-12-06: qty 1000

## 2015-12-06 MED ORDER — CLOPIDOGREL BISULFATE 75 MG PO TABS
ORAL_TABLET | ORAL | Status: AC
  Filled 2015-12-06: qty 8

## 2015-12-06 MED ORDER — ASPIRIN 81 MG PO CHEW
CHEWABLE_TABLET | ORAL | Status: AC
  Filled 2015-12-06: qty 4

## 2015-12-06 MED ORDER — ASPIRIN 81 MG PO CHEW
CHEWABLE_TABLET | ORAL | Status: DC | PRN
  Administered 2015-12-06: 324 mg via ORAL

## 2015-12-06 MED ORDER — SODIUM CHLORIDE 0.9 % IJ SOLN
3.0000 mL | Freq: Two times a day (BID) | INTRAMUSCULAR | Status: DC
  Administered 2015-12-06 – 2015-12-07 (×2): 3 mL via INTRAVENOUS

## 2015-12-06 MED ORDER — SODIUM CHLORIDE 0.9 % IV SOLN: 250.0000 mL | INTRAVENOUS | Status: DC | PRN

## 2015-12-06 MED ORDER — IOHEXOL 300 MG/ML  SOLN
INTRAMUSCULAR | Status: DC | PRN
Start: 2015-12-06 — End: 2015-12-06
  Administered 2015-12-06: 80 mL via INTRA_ARTERIAL
  Administered 2015-12-06: 95 mL via INTRA_ARTERIAL
  Administered 2015-12-06: 30 mL via INTRA_ARTERIAL

## 2015-12-06 MED ORDER — MIDAZOLAM HCL 2 MG/2ML IJ SOLN
INTRAMUSCULAR | Status: DC | PRN
  Administered 2015-12-06: 1 mg via INTRAVENOUS

## 2015-12-06 MED ORDER — ASPIRIN EC 325 MG PO TBEC
325.0000 mg | DELAYED_RELEASE_TABLET | Freq: Every day | ORAL | Status: DC
  Administered 2015-12-07: 325 mg via ORAL
  Filled 2015-12-06: qty 1

## 2015-12-06 MED ORDER — ACETAMINOPHEN 325 MG PO TABS: 650.0000 mg | ORAL_TABLET | ORAL | Status: DC | PRN

## 2015-12-06 MED ORDER — SODIUM CHLORIDE 0.9 % IV SOLN
250.0000 mg | INTRAVENOUS | Status: DC | PRN
  Administered 2015-12-06: 1.75 mg/kg/h via INTRAVENOUS

## 2015-12-06 MED ORDER — ONDANSETRON HCL 4 MG/2ML IJ SOLN: 4.0000 mg | Freq: Four times a day (QID) | INTRAMUSCULAR | Status: DC | PRN

## 2015-12-06 MED ORDER — CLOPIDOGREL BISULFATE 75 MG PO TABS
75.0000 mg | ORAL_TABLET | Freq: Every day | ORAL | Status: DC
  Administered 2015-12-07: 75 mg via ORAL
  Filled 2015-12-06: qty 1

## 2015-12-06 MED ORDER — BIVALIRUDIN 250 MG IV SOLR
INTRAVENOUS | Status: AC
  Filled 2015-12-06: qty 250

## 2015-12-06 SURGICAL SUPPLY — 18 items
BALLN TREK RX 2.5X20 (BALLOONS) ×3
BALLOON TREK RX 2.5X20 (BALLOONS) IMPLANT
CATH INFINITI 5FR ANG PIGTAIL (CATHETERS) ×3 IMPLANT
CATH INFINITI 5FR JL4 (CATHETERS) ×3 IMPLANT
CATH INFINITI 5FR JL5 (CATHETERS) ×2 IMPLANT
CATH INFINITI JR4 5F (CATHETERS) ×3 IMPLANT
CATH VISTA GUIDE 6FR JR4 (CATHETERS) ×2 IMPLANT
DEVICE CLOSURE MYNXGRIP 6/7F (Vascular Products) ×2 IMPLANT
DEVICE INFLAT 30 PLUS (MISCELLANEOUS) ×2 IMPLANT
KIT MANI 3VAL PERCEP (MISCELLANEOUS) ×3 IMPLANT
NDL PERC 18GX7CM (NEEDLE) ×1 IMPLANT
NEEDLE PERC 18GX7CM (NEEDLE) ×3 IMPLANT
PACK CARDIAC CATH (CUSTOM PROCEDURE TRAY) ×3 IMPLANT
SHEATH AVANTI 5FR X 11CM (SHEATH) ×3 IMPLANT
SHEATH AVANTI 6FR X 11CM (SHEATH) ×2 IMPLANT
STENT XIENCE ALPINE RX 3.0X33 (Permanent Stent) ×2 IMPLANT
WIRE ASAHI PROWATER 180CM (WIRE) ×2 IMPLANT
WIRE EMERALD 3MM-J .035X150CM (WIRE) ×3 IMPLANT

## 2015-12-06 NOTE — Progress Notes (Signed)
To cath lab via bed.

## 2015-12-06 NOTE — Progress Notes (Signed)
Pt returned from cath lab s/p rt RCA stent placement.  No distress on ra.  Cardiac monitor in place, pt denies chest pain.  Lungs clear bil.  Dressing to rt groin dry and intact, no bleeding or hematoma noted.  Pulses equal.  IVF infusing well lt hand.  SO at bedside.  Denies need. CB in reach, SR up x 2.

## 2015-12-06 NOTE — Progress Notes (Signed)
Inpatient Diabetes Program Recommendations  AACE/ADA: New Consensus Statement on Inpatient Glycemic Control (2015)  Target Ranges:  Prepandial:   less than 140 mg/dL      Peak postprandial:   less than 180 mg/dL (1-2 hours)      Critically ill patients:  140 - 180 mg/dL   Review of Glycemic Control:  Results for Marc Blair, Marc Blair (MRN [id number]) as of 12/06/2015 12:20  Ref. Range 12/05/2015 07:27 12/05/2015 11:56 12/05/2015 17:21 12/06/2015 11:28  Glucose-Capillary Latest Ref Range: 65-99 mg/dL 086182 (H) 578265 (H) 469177 (H) 277 (H)  Results for Marc Blair, Marc Blair (MRN [id number]) as of 12/06/2015 12:20  Ref. Range 12/05/2015 06:42  Hemoglobin A1C Latest Ref Range: 4.0-6.0 % 8.3 (H)   Diabetes history: Diabetes Mellitus Outpatient Diabetes medications: Metformin 1000 mg bid, Januvia 100 mg daily Current orders for Inpatient glycemic control:  Novolog sensitive tid with meals Inpatient Diabetes Program Recommendations:   Note that blood sugars greater than goal. Please consider adding Levemir 20 units daily while patient is in the hospital.  A1C indicates blood sugar averages greater than goal for the past 3 months.  He will need follow-up with PCP regarding elevated A1C.  Thanks, Beryl MeagerJenny Tahra Hitzeman, RN, BC-ADM Inpatient Diabetes Coordinator Pager [phone] (8a-5p)

## 2015-12-06 NOTE — Care Management (Signed)
Patient with previous cardiac issues and stent.  Presewnts with chest pain and admitted with nstemi.  Cardiac cath and PCI today

## 2015-12-06 NOTE — Progress Notes (Signed)
[facility] [place] [facility]   PATIENT NAME: Marc HighlandMichael Blair    MR#:  [id number]  DATE OF BIRTH:  12/08/1956  SUBJECTIVE:  CHIEF COMPLAINT:  Patient was seen and examined after Cardiac catheterization. Had a stent placement in RCA. Feeling much better. Denies any chest pain or shortness of breath  REVIEW OF SYSTEMS:  CONSTITUTIONAL: No fever, fatigue or weakness.  EYES: No blurred or double vision.  EARS, NOSE, AND THROAT: No tinnitus or ear pain.  RESPIRATORY: No cough, shortness of breath, wheezing or hemoptysis.  CARDIOVASCULAR: Reporting dull chest pain on the left side of the chest, denies orthopnea, edema.  GASTROINTESTINAL: No nausea, vomiting, diarrhea or abdominal pain.  GENITOURINARY: No dysuria, hematuria.  ENDOCRINE: No polyuria, nocturia,  HEMATOLOGY: No anemia, easy bruising or bleeding SKIN: No rash or lesion. MUSCULOSKELETAL: No joint pain or arthritis.   NEUROLOGIC: No tingling, numbness, weakness.  PSYCHIATRY: No anxiety or depression.   DRUG ALLERGIES:   Allergies  Allergen Reactions  . Lipitor [Atorvastatin] Other (See Comments)    "FEELS LIKE SOMEONE BEAT THE DAYLIGHTS OUT OF ME"    VITALS:  Blood pressure 130/64, pulse 61, temperature 98.3 F (36.8 C), temperature source Oral, resp. rate 17, height 6' (1.829 m), weight 119.251 kg (262 lb 14.4 oz), SpO2 98 %.  PHYSICAL EXAMINATION:  GENERAL:  59 y.o.-year-old patient lying in the bed with no acute distress.  EYES: Pupils equal, round, reactive to light and accommodation. No scleral icterus. Extraocular muscles intact.  HEENT: Head atraumatic, normocephalic. Oropharynx and nasopharynx clear.  NECK:  Supple, no jugular venous distention. No thyroid enlargement, no tenderness.  LUNGS: Normal breath sounds bilaterally, no wheezing, rales,rhonchi or crepitation. No use of accessory muscles of respiration.  CARDIOVASCULAR: S1, S2 normal. No murmurs, rubs, or gallops. No  reproducible anterior chest wall tenderness ABDOMEN: Soft, nontender, nondistended. Bowel sounds present. No organomegaly or mass.  EXTREMITIES: No pedal edema, cyanosis, or clubbing.  NEUROLOGIC: Cranial nerves II through XII are intact. Muscle strength 5/5 in all extremities. Sensation intact. Gait not checked.  PSYCHIATRIC: The patient is alert and oriented x 3.  SKIN: No obvious rash, lesion, or ulcer.    LABORATORY PANEL:   CBC  Recent Labs Lab 12/06/15 0523  WBC 4.3  HGB 14.3  HCT 41.4  PLT 198   ------------------------------------------------------------------------------------------------------------------  Chemistries   Recent Labs Lab 12/06/15 0523  NA 141  K 4.3  CL 106  CO2 31  GLUCOSE 216*  BUN 16  CREATININE 1.03  CALCIUM 8.9   ------------------------------------------------------------------------------------------------------------------  Cardiac Enzymes  Recent Labs Lab 12/05/15 1820  TROPONINI 1.01*   ------------------------------------------------------------------------------------------------------------------  RADIOLOGY:  Dg Chest Port 1 View  12/05/2015  CLINICAL DATA:  Acute onset of mid chest pain, radiating to the left, and down the left arm. Initial encounter. EXAM: PORTABLE CHEST 1 VIEW COMPARISON:  None. FINDINGS: The lungs are well-aerated. Minimal bibasilar atelectasis is noted. There is no evidence of pleural effusion or pneumothorax. The cardiomediastinal silhouette is borderline normal in size. No acute osseous abnormalities are seen. There is an accessory articulation between the right second and third ribs. IMPRESSION: Minimal bibasilar atelectasis noted.  Lungs otherwise clear. Electronically Signed   By: Roanna RaiderJeffery  Chang M.D.   On: 12/05/2015 03:35    EKG:   Orders placed or performed during the hospital encounter of 12/05/15  . ED EKG  . ED EKG  . EKG 12-Lead immediately post procedure  . EKG 12-Lead  . EKG 12-Lead  immediately post procedure    ASSESSMENT AND PLAN:   Marc Blair is a 59 y.o. male with a known history of CAD status post stent 2 in 2002, diabetes, hypertension, morbid obesity and hyperlipidemia comes to the emergency room after patient got home from work at midnight had a snack to eat thereafter he started having symptoms of indigestion feeling along with bilateral shoulder pain more so on the left radiating to the left arm.  1. Non-STEMI in the setting of coronary artery disease status post stenting 2 in 2002 with multiple risk factors of morbid obesity hypertension and diabetes Status post stent placement in RCA were 95% stenosis. Tolerated procedure well. Troponins are 0.06-0. 28-0.78 Patient has received Lovenox 1 mg/kg subcutaneous twice a day prior to stent placement which is discontinued Appreciate cardiology recommendations, and is getting discharged in a.m. Continue aspirin, nitroglycerin, beta blockers, ace inhibitors and statins  2. Type 2 diabetes Continue home meds except metformin since patient may require cardiac catheterization Sliding-scale insulin Provide Levemir 20 units subcutaneous once daily during hospital course as home medications are on hold  3. Hypertension Continue Ace inhibitors and beta blockers  4. Hyperlipidemia on statins  5. DVT prophylaxis already on Lovenox treatment dose.     All the records are reviewed and case discussed with Care Management/Social Workerr. Management plans discussed with the patient, family and they are in agreement.  CODE STATUS: Full code  TOTAL CRITICAL CARE TIME TAKING CARE OF THIS PATIENT: 35 minutes.   POSSIBLE D/C IN amDAYS, DEPENDING ON CLINICAL CONDITION.   Ramonita Lab M.D on 12/06/2015 at 4:53 PM  Between 7am to 6pm - Pager - [phone] After 6pm go to [email or web address] - password [email or web address] [facility]  [facility]  Office  [phone]  CC: Primary care physician; No primary care provider on  file.

## 2015-12-07 ENCOUNTER — Encounter: Payer: Self-pay | Admitting: Internal Medicine

## 2015-12-07 LAB — GLUCOSE, CAPILLARY: GLUCOSE-CAPILLARY: 240 mg/dL — AB (ref 65–99)

## 2015-12-07 LAB — CBC
HCT: 40.4 % (ref 40.0–52.0)
HEMOGLOBIN: 13.7 g/dL (ref 13.0–18.0)
MCH: 27.5 pg (ref 26.0–34.0)
MCHC: 33.9 g/dL (ref 32.0–36.0)
MCV: 81.3 fL (ref 80.0–100.0)
PLATELETS: 184 10*3/uL (ref 150–440)
RBC: 4.96 MIL/uL (ref 4.40–5.90)
RDW: 13.4 % (ref 11.5–14.5)
WBC: 3.4 10*3/uL — AB (ref 3.8–10.6)

## 2015-12-07 LAB — BASIC METABOLIC PANEL
ANION GAP: 5 (ref 5–15)
BUN: 12 mg/dL (ref 6–20)
CHLORIDE: 106 mmol/L (ref 101–111)
CO2: 27 mmol/L (ref 22–32)
CREATININE: 0.96 mg/dL (ref 0.61–1.24)
Calcium: 8.8 mg/dL — ABNORMAL LOW (ref 8.9–10.3)
GFR calc non Af Amer: >60 mL/min (ref >60)
Glucose, Bld: 242 mg/dL — ABNORMAL HIGH (ref 65–99)
Potassium: 4 mmol/L (ref 3.5–5.1)
SODIUM: 138 mmol/L (ref 135–145)

## 2015-12-07 MED ORDER — ASPIRIN 325 MG PO TBEC: 325.0000 mg | DELAYED_RELEASE_TABLET | Freq: Every day | ORAL | Status: DC

## 2015-12-07 MED ORDER — METOPROLOL TARTRATE 25 MG PO TABS: 12.5000 mg | ORAL_TABLET | Freq: Two times a day (BID) | ORAL | Status: DC

## 2015-12-07 MED ORDER — CLOPIDOGREL BISULFATE 75 MG PO TABS: 75.0000 mg | ORAL_TABLET | Freq: Every day | ORAL | Status: DC

## 2015-12-07 NOTE — Progress Notes (Signed)
Pt discharged home via wheelchair, family at side. IV discontinued without incident. Home medications reviewed and information given on follow-up appointments. Questions answered and pt verbalized understanding.

## 2015-12-07 NOTE — Progress Notes (Signed)
[facility]  SUBJECTIVE: Don't have chest pain   Filed Vitals:   12/06/15 2007 12/06/15 2045 12/07/15 0453 12/07/15 0800  BP: 138/76  135/69 146/69  Pulse: 65  64 66  Temp: 98.3 F (36.8 C) 99.1 F (37.3 C) 99 F (37.2 C) 98.3 F (36.8 C)  TempSrc: Axillary Oral Oral Oral  Resp: 18  18 16   Height:      Weight:      SpO2: 97%  96% 96%     Intake/Output Summary (Last 24 hours) at 12/07/15 0910 Last data filed at 12/07/15 0700  Gross per 24 hour  Intake   2145 ml  Output   3875 ml  Net  -1730 ml      PHYSICAL EXAM  General: Well developed, well nourished, in no acute distress HEENT:  Normocephalic and atramatic Neck:  No JVD.  Lungs: Clear bilaterally to auscultation and percussion. Heart: HRRR . Normal S1 and S2 without gallops or murmurs.  Abdomen: Bowel sounds are positive, abdomen soft and non-tender  Msk:  Back normal, normal gait. Normal strength and tone for age. Extremities: No clubbing, cyanosis or edema.   Neuro: Alert and oriented X 3. Psych:  Good affect, responds appropriately   LABS: Basic Metabolic Panel:  Recent Labs  10/24/2511/09/16 0523 12/07/15 0609  NA 141 138  K 4.3 4.0  CL 106 106  CO2 31 27  GLUCOSE 216* 242*  BUN 16 12  CREATININE 1.03 0.96  CALCIUM 8.9 8.8*   Liver Function Tests: No results for input(s): AST, ALT, ALKPHOS, BILITOT, PROT, ALBUMIN in the last 72 hours. No results for input(s): LIPASE, AMYLASE in the last 72 hours. CBC:  Recent Labs  12/06/15 0523 12/07/15 0609  WBC 4.3 3.4*  HGB 14.3 13.7  HCT 41.4 40.4  MCV 81.8 81.3  PLT 198 184   Cardiac Enzymes:  Recent Labs  12/05/15 0642 12/05/15 1212 12/05/15 1820  TROPONINI 0.28* 0.78* 1.01*   BNP: Invalid input(s): POCBNP D-Dimer: No results for input(s): DDIMER in the last 72 hours. Hemoglobin A1C:  Recent Labs  12/05/15 0642  HGBA1C 8.3*   Fasting Lipid Panel: No results for input(s): CHOL, HDL, LDLCALC, TRIG, CHOLHDL, LDLDIRECT in the last 72  hours. Thyroid Function Tests: No results for input(s): TSH, T4TOTAL, T3FREE, THYROIDAB in the last 72 hours.  Invalid input(s): FREET3 Anemia Panel: No results for input(s): VITAMINB12, FOLATE, FERRITIN, TIBC, IRON, RETICCTPCT in the last 72 hours.  No results found.   Echo reduced left ventricular function with LV ejection fraction 40-45%  TELEMETRY: Sinus rhythm:  ASSESSMENT AND PLAN:  Active Problems:   Unstable angina (HCC)    1. Non-STEMI, status post drug-eluting stent to coronary artery, chest pain-free  Recommendations  1. Continue dual antiplatelet therapy uninterrupted for 1 year 2. May discharge home, follow-up as outpatient   Shondrea Steinert, MD, PhD, [facility] 12/07/2015 9:10 AM

## 2015-12-07 NOTE — Discharge Summary (Signed)
[facility] [place] [facility]   PATIENT NAME: Marc Blair    MR#:  [id number]  DATE OF BIRTH:  1956/02/24  DATE OF ADMISSION:  12/05/2015 ADMITTING PHYSICIAN: Enedina Finner, MD  DATE OF DISCHARGE: 12/07/2015  PRIMARY CARE PHYSICIAN: No primary care provider on file.    ADMISSION DIAGNOSIS:  NSTEMI (non-ST elevated myocardial infarction) (HCC) [I21.4]  DISCHARGE DIAGNOSIS:  Active Problems:   Unstable angina (HCC)   CAD- s/p RCA stent.  SECONDARY DIAGNOSIS:   Past Medical History  Diagnosis Date  . Hypertension   . Diabetes mellitus without complication (HCC)   . High cholesterol   . MI, old     2002    HOSPITAL COURSE:   Marc Blair is a 59 y.o. male with a known history of CAD status post stent 2 in 2002, diabetes, hypertension, morbid obesity and hyperlipidemia comes to the emergency room after patient got home from work at midnight had a snack to eat thereafter he started having symptoms of indigestion feeling along with bilateral shoulder pain more so on the left radiating to the left arm.  1. Non-STEMI in the setting of coronary artery disease status post stenting 2 in 2002 with multiple risk factors of morbid obesity hypertension and diabetes Status post stent placement in RCA were 95% stenosis. Tolerated procedure well. Troponins are 0.06-0. 28-0.78 Patient has received Lovenox 1 mg/kg subcutaneous twice a day prior to stent placement which is discontinued Appreciate cardiology recommendations Continue aspirin, nitroglycerin, beta blockers, ace inhibitors and statins Added plavix.  2. Type 2 diabetes Continue home meds except metformin since patient may require cardiac catheterization Sliding-scale insulin Provide Levemir 20 units subcutaneous once daily during hospital course as home medications are on hold Resume his metformin and sitagliptine on discharge.  3. Hypertension Continue Ace inhibitors and beta blockers  4.  Hyperlipidemia on statins  5. DVT prophylaxis already on Lovenox treatment dose.   DISCHARGE CONDITIONS:   Stable.  CONSULTS OBTAINED:  Treatment Team:  Marcina Millard, MD Ramonita Lab, MD  DRUG ALLERGIES:   Allergies  Allergen Reactions  . Lipitor [Atorvastatin] Other (See Comments)    "FEELS LIKE SOMEONE BEAT THE DAYLIGHTS OUT OF ME"    DISCHARGE MEDICATIONS:   Current Discharge Medication List    START taking these medications   Details  clopidogrel (PLAVIX) 75 MG tablet Take 1 tablet (75 mg total) by mouth daily with breakfast. Qty: 30 tablet, Refills: 0      CONTINUE these medications which have CHANGED   Details  aspirin EC 325 MG EC tablet Take 1 tablet (325 mg total) by mouth daily. Qty: 30 tablet, Refills: 0      CONTINUE these medications which have NOT CHANGED   Details  ascorbic acid (VITAMIN C) 1000 MG tablet Take 1,000 mg by mouth daily.    atenolol (TENORMIN) 50 MG tablet Take 50 mg by mouth daily.    enalapril (VASOTEC) 10 MG tablet Take 10 mg by mouth daily.    metFORMIN (GLUCOPHAGE) 1000 MG tablet Take 1,000 mg by mouth 2 (two) times daily with a meal.    Multiple Vitamin (MULTIVITAMIN WITH MINERALS) TABS tablet Take 1 tablet by mouth daily.    Omega-3 Fatty Acids (FISH OIL) 1000 MG CAPS Take 2,000 mg by mouth daily.    rosuvastatin (CRESTOR) 10 MG tablet Take 10 mg by mouth daily.    sitaGLIPtin (JANUVIA) 100 MG tablet Take 100 mg by mouth daily.    vitamin B-12 (  CYANOCOBALAMIN) 1000 MCG tablet Take 1,000 mcg by mouth daily.    vitamin E (VITAMIN E) 400 UNIT capsule Take 800 Units by mouth daily.      STOP taking these medications     Bee Pollen 1000 MG TABS          DISCHARGE INSTRUCTIONS:    Follow with cardiology clinic in 2 weeks.  If you experience worsening of your admission symptoms, develop shortness of breath, life threatening emergency, suicidal or homicidal thoughts you must seek medical attention immediately  by calling 911 or calling your MD immediately  if symptoms less severe.  You Must read complete instructions/literature along with all the possible adverse reactions/side effects for all the Medicines you take and that have been prescribed to you. Take any new Medicines after you have completely understood and accept all the possible adverse reactions/side effects.   Please note  You were cared for by a hospitalist during your hospital stay. If you have any questions about your discharge medications or the care you received while you were in the hospital after you are discharged, you can call the unit and asked to speak with the hospitalist on call if the hospitalist that took care of you is not available. Once you are discharged, your primary care physician will handle any further medical issues. Please note that NO REFILLS for any discharge medications will be authorized once you are discharged, as it is imperative that you return to your primary care physician (or establish a relationship with a primary care physician if you do not have one) for your aftercare needs so that they can reassess your need for medications and monitor your lab values.    Today   CHIEF COMPLAINT:   Chief Complaint  Patient presents with  . Chest Pain    HISTORY OF PRESENT ILLNESS:  Marc Blair  is a 59 y.o. male with a known history of CAD status post stent 2 in 2002, diabetes, hypertension, morbid obesity and hyperlipidemia comes to the emergency room after patient got home from work at midnight had a snack to eat thereafter he started having symptoms of indigestion feeling along with bilateral shoulder pain more so on the left radiating to the left arm. Patient took some aspirin but very restless not able to get comfortable and drove himself to the emergency room. His blood pressure was systolic 177. Received another aspirin and nitro paste currently he is chest pain-free. His troponin is 0.06. His EKG shows  normal sinus rhythm with T-wave changes in inferior leads. Drawn have ordered EKG to compare to. Patient is being admitted with unstable angina. He also received Lovenox 1 mg/kg.  VITAL SIGNS:  Blood pressure 146/69, pulse 66, temperature 98.3 F (36.8 C), temperature source Oral, resp. rate 16, height 6' (1.829 m), weight 119.251 kg (262 lb 14.4 oz), SpO2 96 %.  I/O:   Intake/Output Summary (Last 24 hours) at 12/07/15 1033 Last data filed at 12/07/15 0959  Gross per 24 hour  Intake   2385 ml  Output   5375 ml  Net  -2990 ml    PHYSICAL EXAMINATION:   GENERAL: 59 y.o.-year-old patient lying in the bed with no acute distress.  EYES: Pupils equal, round, reactive to light and accommodation. No scleral icterus. Extraocular muscles intact.  HEENT: Head atraumatic, normocephalic. Oropharynx and nasopharynx clear.  NECK: Supple, no jugular venous distention. No thyroid enlargement, no tenderness.  LUNGS: Normal breath sounds bilaterally, no wheezing, rales,rhonchi or crepitation. No use  of accessory muscles of respiration.  CARDIOVASCULAR: S1, S2 normal. No murmurs, rubs, or gallops. No reproducible anterior chest wall tenderness ABDOMEN: Soft, nontender, nondistended. Bowel sounds present. No organomegaly or mass.  EXTREMITIES: No pedal edema, cyanosis, or clubbing.  NEUROLOGIC: Cranial nerves II through XII are intact. Muscle strength 5/5 in all extremities. Sensation intact. Gait not checked.  PSYCHIATRIC: The patient is alert and oriented x 3.  SKIN: No obvious rash, lesion, or ulcer.    DATA REVIEW:   CBC  Recent Labs Lab 12/07/15 0609  WBC 3.4*  HGB 13.7  HCT 40.4  PLT 184    Chemistries   Recent Labs Lab 12/07/15 0609  NA 138  K 4.0  CL 106  CO2 27  GLUCOSE 242*  BUN 12  CREATININE 0.96  CALCIUM 8.8*    Cardiac Enzymes  Recent Labs Lab 12/05/15 1820  TROPONINI 1.01*    Microbiology Results  No results found for this or any previous  visit.  RADIOLOGY:  No results found.    Management plans discussed with the patient, family and they are in agreement.  CODE STATUS:     Code Status Orders        Start     Ordered   12/06/15 0824  Full code   Continuous     12/06/15 0824      TOTAL TIME TAKING CARE OF THIS PATIENT: 35 minutes.    Altamese Dilling M.D on 12/07/2015 at 10:33 AM  Between 7am to 6pm - Pager - [phone]  After 6pm go to [email or web address] - password [email or web address] [facility]  [place] [facility]  Office  [phone]  CC: Primary care physician; No primary care provider on file.   Note: This dictation was prepared with Dragon dictation along with smaller phrase technology. Any transcriptional errors that result from this process are unintentional.

## 2015-12-07 NOTE — Progress Notes (Signed)
[facility]         [place], [place]   12/07/2015  Patient: Marc Blair   Date of Birth:  03/07/1956  Date of admission:  12/05/2015  Date of Discharge  12/07/2015    To Whom it May Concern:   Marc Blair  may return to work on 12/09/15.  PHYSICAL ACTIVITY:  Moderate  If you have any questions or concerns, please don't hesitate to call.  Sincerely,   Altamese DillingVACHHANI, Kimberle Stanfill M.D Pager [phone]- [phone] Office : [phone]   .

## 2019-10-11 ENCOUNTER — Other Ambulatory Visit: Payer: Self-pay | Admitting: Orthopedic Surgery

## 2019-10-11 DIAGNOSIS — S4991XA Unspecified injury of right shoulder and upper arm, initial encounter: Secondary | ICD-10-CM

## 2019-10-11 DIAGNOSIS — S46001A Unspecified injury of muscle(s) and tendon(s) of the rotator cuff of right shoulder, initial encounter: Secondary | ICD-10-CM

## 2019-10-11 DIAGNOSIS — M25311 Other instability, right shoulder: Secondary | ICD-10-CM

## 2019-10-29 ENCOUNTER — Other Ambulatory Visit: Payer: Self-pay

## 2019-10-29 ENCOUNTER — Ambulatory Visit
Admission: RE | Admit: 2019-10-29 | Discharge: 2019-10-29 | Disposition: A | Discharge status: Home / Self Care | Payer: Medicare Other | Source: Ambulatory Visit | Attending: Orthopedic Surgery | Admitting: Orthopedic Surgery

## 2019-10-29 DIAGNOSIS — M25311 Other instability, right shoulder: Secondary | ICD-10-CM

## 2019-10-29 DIAGNOSIS — S46001A Unspecified injury of muscle(s) and tendon(s) of the rotator cuff of right shoulder, initial encounter: Secondary | ICD-10-CM | POA: Diagnosis present

## 2019-10-29 DIAGNOSIS — S4991XA Unspecified injury of right shoulder and upper arm, initial encounter: Secondary | ICD-10-CM

## 2020-07-17 ENCOUNTER — Other Ambulatory Visit: Payer: Self-pay

## 2020-07-17 ENCOUNTER — Other Ambulatory Visit
Admission: RE | Admit: 2020-07-17 | Discharge: 2020-07-17 | Disposition: A | Discharge status: Home / Self Care | Payer: Medicare Other | Source: Ambulatory Visit | Attending: Gastroenterology | Admitting: Gastroenterology

## 2020-07-17 DIAGNOSIS — Z20822 Contact with and (suspected) exposure to covid-19: Secondary | ICD-10-CM | POA: Insufficient documentation

## 2020-07-17 DIAGNOSIS — Z01812 Encounter for preprocedural laboratory examination: Secondary | ICD-10-CM | POA: Diagnosis present

## 2020-07-18 ENCOUNTER — Encounter: Payer: Self-pay | Admitting: *Deleted

## 2020-07-18 LAB — SARS CORONAVIRUS 2 (TAT 6-24 HRS): SARS Coronavirus 2: NEGATIVE

## 2020-07-19 ENCOUNTER — Encounter
Admission: RE | Disposition: A | Discharge status: Home / Self Care | Payer: Self-pay | Source: Home / Self Care | Attending: Gastroenterology

## 2020-07-19 ENCOUNTER — Other Ambulatory Visit: Payer: Self-pay

## 2020-07-19 ENCOUNTER — Ambulatory Visit: Payer: Medicare Other | Admitting: Certified Registered"

## 2020-07-19 ENCOUNTER — Ambulatory Visit
Admission: RE | Admit: 2020-07-19 | Discharge: 2020-07-19 | Disposition: A | Discharge status: Home / Self Care | Payer: Medicare Other | Attending: Gastroenterology | Admitting: Gastroenterology

## 2020-07-19 ENCOUNTER — Encounter: Payer: Self-pay | Admitting: *Deleted

## 2020-07-19 DIAGNOSIS — E669 Obesity, unspecified: Secondary | ICD-10-CM | POA: Diagnosis not present

## 2020-07-19 DIAGNOSIS — Z1211 Encounter for screening for malignant neoplasm of colon: Secondary | ICD-10-CM | POA: Diagnosis not present

## 2020-07-19 DIAGNOSIS — I252 Old myocardial infarction: Secondary | ICD-10-CM | POA: Insufficient documentation

## 2020-07-19 DIAGNOSIS — Z6835 Body mass index (BMI) 35.0-35.9, adult: Secondary | ICD-10-CM | POA: Insufficient documentation

## 2020-07-19 DIAGNOSIS — I110 Hypertensive heart disease with heart failure: Secondary | ICD-10-CM | POA: Insufficient documentation

## 2020-07-19 DIAGNOSIS — Z7982 Long term (current) use of aspirin: Secondary | ICD-10-CM | POA: Diagnosis not present

## 2020-07-19 DIAGNOSIS — E119 Type 2 diabetes mellitus without complications: Secondary | ICD-10-CM | POA: Insufficient documentation

## 2020-07-19 DIAGNOSIS — G4730 Sleep apnea, unspecified: Secondary | ICD-10-CM | POA: Insufficient documentation

## 2020-07-19 DIAGNOSIS — Z87891 Personal history of nicotine dependence: Secondary | ICD-10-CM | POA: Insufficient documentation

## 2020-07-19 DIAGNOSIS — Z79899 Other long term (current) drug therapy: Secondary | ICD-10-CM | POA: Insufficient documentation

## 2020-07-19 DIAGNOSIS — I509 Heart failure, unspecified: Secondary | ICD-10-CM | POA: Diagnosis not present

## 2020-07-19 DIAGNOSIS — Z794 Long term (current) use of insulin: Secondary | ICD-10-CM | POA: Diagnosis not present

## 2020-07-19 DIAGNOSIS — Z7902 Long term (current) use of antithrombotics/antiplatelets: Secondary | ICD-10-CM | POA: Insufficient documentation

## 2020-07-19 DIAGNOSIS — I2510 Atherosclerotic heart disease of native coronary artery without angina pectoris: Secondary | ICD-10-CM | POA: Insufficient documentation

## 2020-07-19 DIAGNOSIS — Z9862 Peripheral vascular angioplasty status: Secondary | ICD-10-CM | POA: Diagnosis not present

## 2020-07-19 DIAGNOSIS — E7800 Pure hypercholesterolemia, unspecified: Secondary | ICD-10-CM | POA: Diagnosis not present

## 2020-07-19 DIAGNOSIS — K621 Rectal polyp: Secondary | ICD-10-CM | POA: Diagnosis not present

## 2020-07-19 DIAGNOSIS — K648 Other hemorrhoids: Secondary | ICD-10-CM | POA: Insufficient documentation

## 2020-07-19 DIAGNOSIS — Z955 Presence of coronary angioplasty implant and graft: Secondary | ICD-10-CM | POA: Insufficient documentation

## 2020-07-19 HISTORY — DX: Atherosclerotic heart disease of native coronary artery without angina pectoris: I25.10

## 2020-07-19 HISTORY — DX: Heart failure, unspecified: I50.9

## 2020-07-19 HISTORY — DX: Peripheral vascular angioplasty status: Z98.62

## 2020-07-19 HISTORY — DX: Gastro-esophageal reflux disease without esophagitis: K21.9

## 2020-07-19 HISTORY — PX: COLONOSCOPY WITH PROPOFOL: SHX5780

## 2020-07-19 HISTORY — DX: Obesity, unspecified: E66.9

## 2020-07-19 HISTORY — DX: Sleep apnea, unspecified: G47.30

## 2020-07-19 LAB — GLUCOSE, CAPILLARY: Glucose-Capillary: 196 mg/dL — ABNORMAL HIGH (ref 70–99)

## 2020-07-19 SURGERY — COLONOSCOPY WITH PROPOFOL
Anesthesia: General

## 2020-07-19 MED ORDER — SODIUM CHLORIDE 0.9 % IV SOLN: INTRAVENOUS | Status: DC

## 2020-07-19 MED ORDER — LIDOCAINE HCL (PF) 2 % IJ SOLN
INTRAMUSCULAR | Status: AC
  Filled 2020-07-19: qty 5

## 2020-07-19 MED ORDER — PROPOFOL 500 MG/50ML IV EMUL
INTRAVENOUS | Status: AC
  Filled 2020-07-19: qty 50

## 2020-07-19 MED ORDER — PROPOFOL 500 MG/50ML IV EMUL
INTRAVENOUS | Status: DC | PRN
  Administered 2020-07-19: 111 ug/kg/min via INTRAVENOUS

## 2020-07-19 MED ORDER — PROPOFOL 10 MG/ML IV BOLUS
INTRAVENOUS | Status: AC
  Filled 2020-07-19: qty 20

## 2020-07-19 NOTE — Transfer of Care (Signed)
Immediate Anesthesia Transfer of Care Note  Patient: Marc Blair  Procedure(s) Performed: COLONOSCOPY WITH PROPOFOL (N/A )  Patient Location: PACU and Endoscopy Unit  Anesthesia Type:General  Level of Consciousness: sedated  Airway & Oxygen Therapy: Patient Spontanous Breathing, Patient connected to face mask oxygen and Patient connected to face mask  Post-op Assessment: Report given to RN and Post -op Vital signs reviewed and stable  Post vital signs: Reviewed and stable  Last Vitals:  Vitals Value Taken Time  BP 96/58 07/19/20 0844  Temp    Pulse 52 07/19/20 0844  Resp 18 07/19/20 0844  SpO2 100 % 07/19/20 0844  Vitals shown include unvalidated device data.  Last Pain:  Vitals:   07/19/20 0728  TempSrc: Temporal  PainSc: 0-No pain         Complications: No complications documented.

## 2020-07-19 NOTE — Anesthesia Postprocedure Evaluation (Signed)
Anesthesia Post Note  Patient: Marc Blair  Procedure(s) Performed: COLONOSCOPY WITH PROPOFOL (N/A )  Patient location during evaluation: Endoscopy Anesthesia Type: General Level of consciousness: awake and alert Pain management: pain level controlled Vital Signs Assessment: post-procedure vital signs reviewed and stable Respiratory status: spontaneous breathing, nonlabored ventilation, respiratory function stable and patient connected to nasal cannula oxygen Cardiovascular status: blood pressure returned to baseline and stable Postop Assessment: no apparent nausea or vomiting Anesthetic complications: no   No complications documented.   Last Vitals:  Vitals:   07/19/20 0903 07/19/20 0913  BP:  107/72  Pulse: 50 52  Resp: 13 17  Temp:    SpO2: 97% 99%    Last Pain:  Vitals:   07/19/20 0913  TempSrc:   PainSc: 0-No pain                 Lenard Simmer

## 2020-07-19 NOTE — Interval H&P Note (Signed)
History and Physical Interval Note:  07/19/2020 7:51 AM  Marc Blair  has presented today for surgery, with the diagnosis of SCREENING.  The various methods of treatment have been discussed with the patient and family. After consideration of risks, benefits and other options for treatment, the patient has consented to  Procedure(s): COLONOSCOPY WITH PROPOFOL (N/A) as a surgical intervention.  The patient's history has been reviewed, patient examined, no change in status, stable for surgery.  I have reviewed the patient's chart and labs.  Questions were answered to the patient's satisfaction.     Regis Bill  Ok to proceed with colonoscopy.

## 2020-07-19 NOTE — Progress Notes (Incomplete)
   07/19/20 0745  Clinical Encounter Type  Visited With Family  Visit Type Initial  Referral From Chaplain  Consult/Referral To Chaplain  While walking through [facility] waiting area, Chaplain spoke to Pt's brother in the waiting area. She asked how he was doing and he said he was fine, waiting for his brother. Brother said he was fine because he has had one himself. Chaplain asked what Pt was having and he said a colonoscopy. Chaplain explained she was checking on those in the waiting area to make sure they were ok and he said "you are great." Chaplain said thank and wished him well.

## 2020-07-19 NOTE — Anesthesia Preprocedure Evaluation (Signed)
Anesthesia Evaluation  Patient identified by MRN, date of birth, ID band Patient awake    Reviewed: Allergy & Precautions, H&P , NPO status , Patient's Chart, lab work & pertinent test results, reviewed documented beta blocker date and time   History of Anesthesia Complications Negative for: history of anesthetic complications  Airway Mallampati: III  TM Distance: >3 FB Neck ROM: full    Dental  (+) Dental Advidsory Given, Partial Upper, Partial Lower   Pulmonary neg shortness of breath, sleep apnea and Continuous Positive Airway Pressure Ventilation , neg COPD, neg recent URI, former smoker,    Pulmonary exam normal breath sounds clear to auscultation       Cardiovascular Exercise Tolerance: Good hypertension, + angina (stable) + CAD, + Past MI, + Cardiac Stents and +CHF  Normal cardiovascular exam(-) dysrhythmias (-) Valvular Problems/Murmurs Rhythm:regular Rate:Normal     Neuro/Psych negative neurological ROS  negative psych ROS   GI/Hepatic Neg liver ROS, GERD  ,  Endo/Other  diabetes  Renal/GU negative Renal ROS  negative genitourinary   Musculoskeletal   Abdominal   Peds  Hematology negative hematology ROS (+)   Anesthesia Other Findings Past Medical History: No date: CHF (congestive heart failure) (HCC) No date: Coronary artery disease No date: Diabetes mellitus without complication (HCC) No date: GERD (gastroesophageal reflux disease) No date: H/O angioplasty No date: High cholesterol No date: Hypertension No date: MI, 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 No date: Obesity No date: Sleep apnea Dec 2016: Stented coronary artery   Reproductive/Obstetrics negative OB ROS                             Anesthesia Physical Anesthesia Plan  ASA: III  Anesthesia Plan: General   Post-op Pain Management:    Induction: Intravenous  PONV Risk Score and Plan: 2 and Propofol infusion and  TIVA  Airway Management Planned: Natural Airway and Nasal Cannula  Additional Equipment:   Intra-op Plan:   Post-operative Plan:   Informed Consent: I have reviewed the patients History and Physical, chart, labs and discussed the procedure including the risks, benefits and alternatives for the proposed anesthesia with the patient or authorized representative who has indicated his/her understanding and acceptance.     Dental Advisory Given  Plan Discussed with: Anesthesiologist, CRNA and Surgeon  Anesthesia Plan Comments:         Anesthesia Quick Evaluation

## 2020-07-19 NOTE — Op Note (Signed)
[facility] Gastroenterology Patient Name: Marc Blair Procedure Date: 07/19/2020 7:26 AM MRN: [id number] Account #: [id number] Date of Birth: 06-13-1956 Admit Type: Outpatient Age: 64 Room: [facility] ENDO ROOM 3 Gender: Male Note Status: Finalized Procedure:             Colonoscopy Indications:           Screening for colorectal malignant neoplasm Providers:             Andrey Farmer MD, MD Medicines:             Monitored Anesthesia Care Complications:         No immediate complications. Estimated blood loss:                         Minimal. Procedure:             Pre-Anesthesia Assessment:                        - Prior to the procedure, a History and Physical was                         performed, and patient medications and allergies were                         reviewed. The patient is competent. The risks and                         benefits of the procedure and the sedation options and                         risks were discussed with the patient. All questions                         were answered and informed consent was obtained.                         Patient identification and proposed procedure were                         verified by the physician, the nurse, the anesthetist                         and the technician in the endoscopy suite. Mental                         Status Examination: alert and oriented. Airway                         Examination: normal oropharyngeal airway and neck                         mobility. Respiratory Examination: clear to                         auscultation. CV Examination: normal. Prophylactic                         Antibiotics: The patient does not require prophylactic  antibiotics. Prior Anticoagulants: The patient has                         taken no previous anticoagulant or antiplatelet                         agents. ASA Grade Assessment: III - A patient with                          severe systemic disease. After reviewing the risks and                         benefits, the patient was deemed in satisfactory                         condition to undergo the procedure. The anesthesia                         plan was to use monitored anesthesia care (MAC).                         Immediately prior to administration of medications,                         the patient was re-assessed for adequacy to receive                         sedatives. The heart rate, respiratory rate, oxygen                         saturations, blood pressure, adequacy of pulmonary                         ventilation, and response to care were monitored                         throughout the procedure. The physical status of the                         patient was re-assessed after the procedure.                        After obtaining informed consent, the colonoscope was                         passed under direct vision. Throughout the procedure,                         the patient's blood pressure, pulse, and oxygen                         saturations were monitored continuously. The                         Colonoscope was introduced through the anus and                         advanced to the the cecum, identified by appendiceal  orifice and ileocecal valve. The colonoscopy was                         performed without difficulty. The patient tolerated                         the procedure well. The quality of the bowel                         preparation was good. Findings:      The perianal and digital rectal examinations were normal.      A localized area of mildly erythematous mucosa was found in the sigmoid       colon. Biopsies were taken with a cold forceps for histology. Estimated       blood loss was minimal.      A 3 mm polyp was found in the rectum. The polyp was sessile. The polyp       was removed with a cold snare. Resection and retrieval were complete.        Estimated blood loss was minimal.      Internal hemorrhoids were found during retroflexion. The hemorrhoids       were large.      The exam was otherwise without abnormality on direct and retroflexion       views. Impression:            - Erythematous mucosa in the sigmoid colon. Biopsied.                        - One 3 mm polyp in the rectum, removed with a cold                         snare. Resected and retrieved.                        - Internal hemorrhoids.                        - The examination was otherwise normal on direct and                         retroflexion views. Recommendation:        - Discharge patient to home.                        - Resume previous diet.                        - Continue present medications.                        - Await pathology results.                        - Repeat colonoscopy in 10 years for surveillance but                         this could change based on pathology results.                        - Return to referring physician as previously  scheduled. Procedure Code(s):     --- Professional ---                        [phone], Colonoscopy, flexible; with removal of                         tumor(s), polyp(s), or other lesion(s) by snare                         technique                        45380, 73, Colonoscopy, flexible; with biopsy, single                         or multiple Diagnosis Code(s):     --- Professional ---                        Z12.11, Encounter for screening for malignant neoplasm                         of colon                        K63.89, Other specified diseases of intestine                        K62.1, Rectal polyp                        K64.8, Other hemorrhoids CPT copyright 2019 American Medical Association. All rights reserved. The codes documented in this report are preliminary and upon coder review may  be revised to meet current compliance requirements. Andrey Farmer,  MD Andrey Farmer MD, MD 07/19/2020 8:43:59 AM Number of Addenda: 0 Note Initiated On: 07/19/2020 7:26 AM Scope Withdrawal Time: 0 hours 18 minutes 7 seconds  Total Procedure Duration: 0 hours 27 minutes 31 seconds  Estimated Blood Loss:  Estimated blood loss was minimal.      [facility]

## 2020-07-19 NOTE — Progress Notes (Deleted)
   07/19/20 0740  Clinical Encounter Type  Visited With Family  Visit Type Initial  Referral From Chaplain  Consult/Referral To Chaplain  While walking through [facility] waiting area, Chaplain spoke to Pt's brother in the waiting area. She asked how he was doing and he said he was fine, waiting for his brother. Brother said he was fine because he has had one himself. Chaplain asked what Pt was having and he said a colonoscopy. Chaplain explained she was checking on those in the waiting area to make sure they were ok and he said "you are great." Chaplain said thank and wished him well.

## 2020-07-19 NOTE — Anesthesia Procedure Notes (Signed)
Performed by: Taelyn Broecker R, CRNA Oxygen Delivery Method: Supernova nasal CPAP       

## 2020-07-19 NOTE — H&P (Signed)
Outpatient short stay form Pre-procedure 07/19/2020 7:48 AM Merlyn Lot MD, MPH  Primary Physician: NP Gauger  Reason for visit:  Screening Colonoscopy  History of present illness:  64 y/o gentleman here for screening colonoscopy. Never had one before. No blood thinners, no abdominal surgeries. No family history of GI malignancies.    Current Facility-Administered Medications:  .  0.9 %  sodium chloride infusion, , Intravenous, Continuous, Zigmond Trela, Rossie Muskrat, MD  Medications Prior to Admission  Medication Sig Dispense Refill Last Dose  . Alpha-Lipoic Acid 600 MG CAPS Take 600 mg by mouth.   Past Month at Unknown time  . ascorbic acid (VITAMIN C) 1000 MG tablet Take 1,000 mg by mouth daily.   Past Week at Unknown time  . aspirin EC 81 MG tablet Take 81 mg by mouth daily. Swallow whole.   Past Week at Unknown time  . atenolol (TENORMIN) 50 MG tablet Take 50 mg by mouth daily.   07/19/2020 at Unknown time  . enalapril (VASOTEC) 10 MG tablet Take 10 mg by mouth daily.   07/19/2020 at Unknown time  . glimepiride (AMARYL) 4 MG tablet Take 4 mg by mouth daily with breakfast.   Past Week at Unknown time  . insulin glargine (LANTUS) 100 UNIT/ML injection Inject into the skin daily.   07/18/2020 at Unknown time  . metFORMIN (GLUCOPHAGE) 1000 MG tablet Take 1,000 mg by mouth 2 (two) times daily with a meal.   Past Week at Unknown time  . Multiple Vitamin (MULTIVITAMIN WITH MINERALS) TABS tablet Take 1 tablet by mouth daily.   Past Week at Unknown time  . Omega-3 Fatty Acids (FISH OIL) 1000 MG CAPS Take 2,000 mg by mouth daily.   Past Week at Unknown time  . RIBOFLAVIN PO Take 100 mg by mouth.   Past Week at Unknown time  . rosuvastatin (CRESTOR) 10 MG tablet Take 10 mg by mouth daily.   Past Week at Unknown time  . sitaGLIPtin (JANUVIA) 100 MG tablet Take 100 mg by mouth daily.   Past Week at Unknown time  . traMADol (ULTRAM) 50 MG tablet Take 50 mg by mouth every 6 (six) hours as needed.   Past  Week at Unknown time  . vitamin B-12 (CYANOCOBALAMIN) 1000 MCG tablet Take 1,000 mcg by mouth daily.   Past Week at Unknown time  . vitamin E (VITAMIN E) 400 UNIT capsule Take 800 Units by mouth daily.   Past Week at Unknown time  . aspirin EC 325 MG EC tablet Take 1 tablet (325 mg total) by mouth daily. (Patient not taking: Reported on 07/19/2020) 30 tablet 0 Not Taking at Unknown time  . clopidogrel (PLAVIX) 75 MG tablet Take 1 tablet (75 mg total) by mouth daily with breakfast. (Patient not taking: Reported on 07/19/2020) 30 tablet 0 Not Taking at Unknown time  . sitaGLIPtin (JANUVIA) 100 MG tablet Take 100 mg by mouth daily. (Patient not taking: Reported on 07/19/2020)   Completed Course at Unknown time     Allergies  Allergen Reactions  . Sudafed [Pseudoephedrine] Other (See Comments)    Weakness, Diaphoresis  . Lipitor [Atorvastatin] Other (See Comments)    "FEELS LIKE SOMEONE BEAT THE DAYLIGHTS OUT OF ME"     Past Medical History:  Diagnosis Date  . CHF (congestive heart failure) (HCC)   . Coronary artery disease   . Diabetes mellitus without complication (HCC)   . GERD (gastroesophageal reflux disease)   . H/O angioplasty   . High  cholesterol   . Hypertension   . MI, old    2002  . Obesity   . Sleep apnea   . Stented coronary artery Dec 2016    Review of systems:  Otherwise negative.    Physical Exam  Gen: Alert, oriented.  HEENT: PERRLA. Lungs: No respiratory distress Abd: soft, benign, no masses Ext: No edema    Planned procedures: Proceed with colonoscopy. The patient understands the nature of the planned procedure, indications, risks, alternatives and potential complications including but not limited to bleeding, infection, perforation, damage to internal organs and possible oversedation/side effects from anesthesia. The patient agrees and gives consent to proceed.  Please refer to procedure notes for findings, recommendations and patient  disposition/instructions.     Merlyn Lot MD, MPH. Gastroenterology 07/19/2020  7:48 AM

## 2020-07-22 ENCOUNTER — Encounter: Payer: Self-pay | Admitting: Gastroenterology

## 2020-07-22 LAB — SURGICAL PATHOLOGY

## 2020-10-07 ENCOUNTER — Other Ambulatory Visit: Payer: Self-pay | Admitting: Nurse Practitioner

## 2020-10-07 DIAGNOSIS — Z122 Encounter for screening for malignant neoplasm of respiratory organs: Secondary | ICD-10-CM

## 2020-10-07 DIAGNOSIS — Z87891 Personal history of nicotine dependence: Secondary | ICD-10-CM

## 2020-10-10 ENCOUNTER — Ambulatory Visit
Admission: RE | Admit: 2020-10-10 | Discharge: 2020-10-10 | Disposition: A | Discharge status: Home / Self Care | Payer: Medicare Other | Source: Ambulatory Visit | Attending: Nurse Practitioner | Admitting: Nurse Practitioner

## 2020-10-10 ENCOUNTER — Other Ambulatory Visit: Payer: Self-pay

## 2020-10-10 DIAGNOSIS — Z122 Encounter for screening for malignant neoplasm of respiratory organs: Secondary | ICD-10-CM | POA: Insufficient documentation

## 2020-10-10 DIAGNOSIS — Z87891 Personal history of nicotine dependence: Secondary | ICD-10-CM | POA: Diagnosis present

## 2020-10-22 ENCOUNTER — Other Ambulatory Visit: Payer: Self-pay | Admitting: Physician Assistant

## 2020-10-22 DIAGNOSIS — H9319 Tinnitus, unspecified ear: Secondary | ICD-10-CM

## 2020-11-07 ENCOUNTER — Other Ambulatory Visit: Payer: Self-pay

## 2020-11-07 ENCOUNTER — Ambulatory Visit
Admission: RE | Admit: 2020-11-07 | Discharge: 2020-11-07 | Disposition: A | Discharge status: Home / Self Care | Payer: Medicare Other | Source: Ambulatory Visit | Attending: Physician Assistant | Admitting: Physician Assistant

## 2020-11-07 DIAGNOSIS — H9319 Tinnitus, unspecified ear: Secondary | ICD-10-CM

## 2020-11-07 MED ORDER — GADOBUTROL 1 MMOL/ML IV SOLN
10.0000 mL | Freq: Once | INTRAVENOUS | Status: AC | PRN
  Administered 2020-11-07: 10 mL via INTRAVENOUS

## 2021-10-09 ENCOUNTER — Other Ambulatory Visit: Payer: Self-pay | Admitting: Nurse Practitioner

## 2021-10-13 ENCOUNTER — Other Ambulatory Visit: Payer: Self-pay | Admitting: *Deleted

## 2021-10-13 DIAGNOSIS — Z87891 Personal history of nicotine dependence: Secondary | ICD-10-CM

## 2021-10-29 ENCOUNTER — Ambulatory Visit
Admission: RE | Admit: 2021-10-29 | Discharge: 2021-10-29 | Disposition: A | Discharge status: Home / Self Care | Payer: Medicare Other | Source: Ambulatory Visit | Attending: Acute Care | Admitting: Acute Care

## 2021-10-29 ENCOUNTER — Encounter: Payer: Self-pay | Admitting: Acute Care

## 2021-10-29 ENCOUNTER — Other Ambulatory Visit: Payer: Self-pay

## 2021-10-29 ENCOUNTER — Telehealth (INDEPENDENT_AMBULATORY_CARE_PROVIDER_SITE_OTHER): Payer: Medicare Other | Admitting: Acute Care

## 2021-10-29 DIAGNOSIS — Z87891 Personal history of nicotine dependence: Secondary | ICD-10-CM | POA: Diagnosis not present

## 2021-10-29 NOTE — Patient Instructions (Signed)
Thank you for participating in the [facility]. It was our pleasure to meet you today. We will call you with the results of your scan within the next few days. Your scan will be assigned a Lung RADS category score by the physicians reading the scans.  This Lung RADS score determines follow up scanning.  See below for description of categories, and follow up screening recommendations. We will be in touch to schedule your follow up screening annually or based on recommendations of our providers. We will fax a copy of your scan results to your Primary Care Physician, or the physician who referred you to the program, to ensure they have the results. Please call the office if you have any questions or concerns regarding your scanning experience or results.  Our office number is [phone]. Please speak with Denise Phelps, RN. She is our Lung Cancer Screening RN. If she is unavailable when you call, please have the office staff send her a message. She will return your call at her earliest convenience. Remember, if your scan is normal, we will scan you annually as long as you continue to meet the criteria for the program. (Age 55-77, Current smoker or smoker who has quit within the last 15 years). If you are a smoker, remember, quitting is the single most powerful action that you can take to decrease your risk of lung cancer and other pulmonary, breathing related problems. We know quitting is hard, and we are here to help.  Please let us know if there is anything we can do to help you meet your goal of quitting. If you are a former smoker, congratulations. We are proud of you! Remain smoke free! Remember you can refer friends or family members through the number above.  We will screen them to make sure they meet criteria for the program. Thank you for helping us take better care of you by participating in Lung Screening.  Lung RADS Categories:  Lung RADS 1: no nodules  or definitely non-concerning nodules.  Recommendation is for a repeat annual scan in 12 months.  Lung RADS 2:  nodules that are non-concerning in appearance and behavior with a very low likelihood of becoming an active cancer. Recommendation is for a repeat annual scan in 12 months.  Lung RADS 3: nodules that are probably non-concerning , includes nodules with a low likelihood of becoming an active cancer.  Recommendation is for a 6-month repeat screening scan. Often noted after an upper respiratory illness. We will be in touch to make sure you have no questions, and to schedule your 6-month scan.  Lung RADS 4 A: nodules with concerning findings, recommendation is most often for a follow up scan in 3 months or additional testing based on our provider's assessment of the scan. We will be in touch to make sure you have no questions and to schedule the recommended 3 month follow up scan.  Lung RADS 4 B:  indicates findings that are concerning. We will be in touch with you to schedule additional diagnostic testing based on our provider's  assessment of the scan.   

## 2021-10-29 NOTE — Progress Notes (Signed)
Virtual Visit via Video Note  I connected with [name] on 10/29/21 at 10:00 AM EDT by a video enabled telemedicine application and verified that I am speaking with the correct person using two identifiers.  Location: Patient: At home Provider: [address] [place], [place], [address]    I discussed the limitations of evaluation and management by telemedicine and the availability of in person appointments. The patient expressed understanding and agreed to proceed.  Shared Decision Making Visit Lung Cancer Screening Program [phone])   Eligibility: Age 29 y.o. Pack Years Smoking History Calculation 90 pack year smoking history (# packs/per year x # years smoked) Recent History of coughing up blood  no Unexplained weight loss? no ( >Than 15 pounds within the last 6 months ) Prior History Lung / other cancer no (Diagnosis within the last 5 years already requiring surveillance chest CT Scans). Smoking Status Former Smoker Former Smokers: Years since quit: 5 years  Quit Date: 2017  Visit Components: Discussion included one or more decision making aids. yes Discussion included risk/benefits of screening. yes Discussion included potential follow up diagnostic testing for abnormal scans. yes Discussion included meaning and risk of over diagnosis. yes Discussion included meaning and risk of False Positives. yes Discussion included meaning of total radiation exposure. yes  Counseling Included: Importance of adherence to annual lung cancer LDCT screening. yes Impact of comorbidities on ability to participate in the program. yes Ability and willingness to under diagnostic treatment. yes  Smoking Cessation Counseling: Current Smokers:  Discussed importance of smoking cessation. yes Information about tobacco cessation classes and interventions provided to patient. yes Patient provided with "ticket" for LDCT Scan. yes Symptomatic Patient. no  Counseling NA Diagnosis Code:  Tobacco Use Z72.0 Asymptomatic Patient yes  Counseling (Intermediate counseling: > three minutes counseling) [id number] Former Smokers:  Discussed the importance of maintaining cigarette abstinence. yes Diagnosis Code: Personal History of Nicotine Dependence. W23.762 Information about tobacco cessation classes and interventions provided to patient. Yes Patient provided with "ticket" for LDCT Scan. yes Written Order for Lung Cancer Screening with LDCT placed in Epic. Yes (CT Chest Lung Cancer Screening Low Dose W/O CM) [id number] Z12.2-Screening of respiratory organs Z87.891-Personal history of nicotine dependence   Bevelyn Ngo, NP 10/29/2021

## 2021-11-05 ENCOUNTER — Other Ambulatory Visit: Payer: Self-pay | Admitting: Acute Care

## 2021-11-05 DIAGNOSIS — Z87891 Personal history of nicotine dependence: Secondary | ICD-10-CM

## 2022-10-29 ENCOUNTER — Ambulatory Visit
Admission: RE | Admit: 2022-10-29 | Discharge: 2022-10-29 | Disposition: A | Discharge status: Home / Self Care | Payer: Medicare Other | Source: Ambulatory Visit | Attending: Acute Care | Admitting: Acute Care

## 2022-10-29 DIAGNOSIS — I2510 Atherosclerotic heart disease of native coronary artery without angina pectoris: Secondary | ICD-10-CM | POA: Diagnosis not present

## 2022-10-29 DIAGNOSIS — K760 Fatty (change of) liver, not elsewhere classified: Secondary | ICD-10-CM | POA: Insufficient documentation

## 2022-10-29 DIAGNOSIS — I700 Atherosclerosis of aorta: Secondary | ICD-10-CM | POA: Insufficient documentation

## 2022-10-29 DIAGNOSIS — Z87891 Personal history of nicotine dependence: Secondary | ICD-10-CM | POA: Diagnosis not present

## 2022-10-29 DIAGNOSIS — Z122 Encounter for screening for malignant neoplasm of respiratory organs: Secondary | ICD-10-CM | POA: Insufficient documentation

## 2022-10-29 DIAGNOSIS — J439 Emphysema, unspecified: Secondary | ICD-10-CM | POA: Insufficient documentation

## 2022-11-02 ENCOUNTER — Other Ambulatory Visit: Payer: Self-pay | Admitting: Acute Care

## 2022-11-02 DIAGNOSIS — Z122 Encounter for screening for malignant neoplasm of respiratory organs: Secondary | ICD-10-CM

## 2022-11-02 DIAGNOSIS — Z87891 Personal history of nicotine dependence: Secondary | ICD-10-CM

## 2023-01-14 IMAGING — CT CT CHEST LUNG CANCER SCREENING LOW DOSE W/O CM
1 series · 15 of 32 positions shown, 19 images · non-contrast
Comparison: 10/10/2020

CLINICAL DATA: 65-year-old male with 90 pack-year history of
smoking. Lung cancer screening.

EXAM:
CT CHEST WITHOUT CONTRAST LOW-DOSE FOR LUNG CANCER SCREENING
TECHNIQUE: Multidetector CT imaging of the chest was performed following the
standard protocol without IV contrast.

[Series 3: axial st · axial · 0.77mm/px · z∈[-713,-403]mm · 15 of 71 slices shown, 19 images]
[im 6/71  mediastinal]
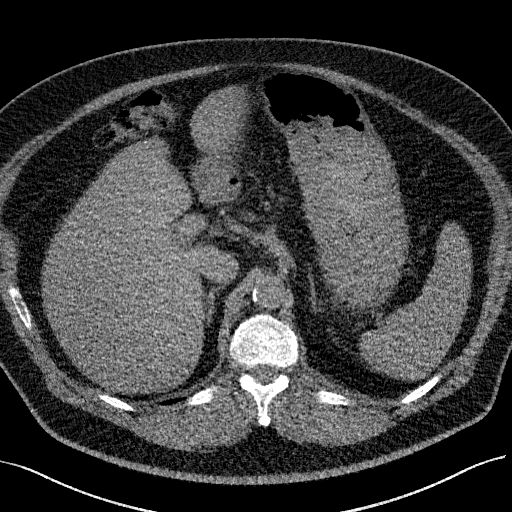
[im 6/71  lung]
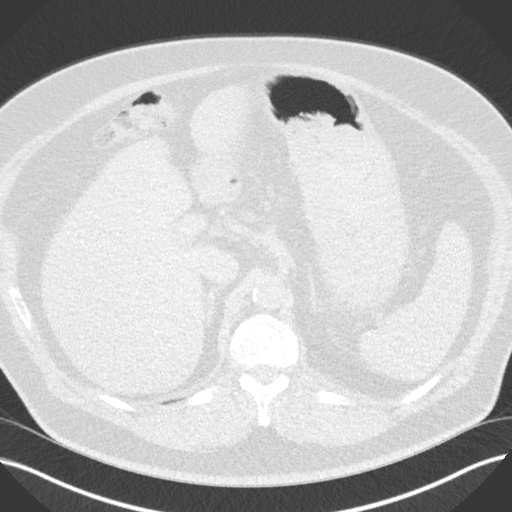
[im 11/71  lung]
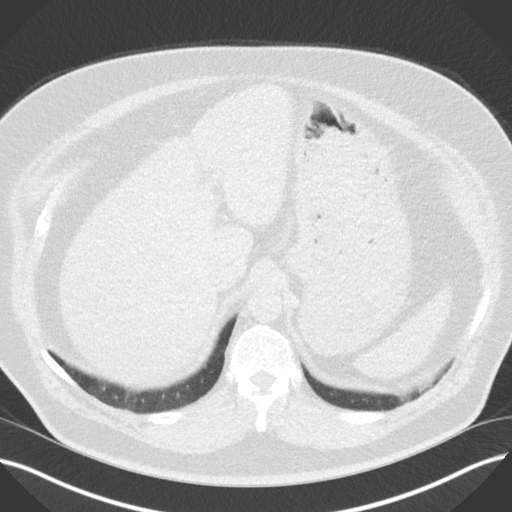
[im 15/71  lung]
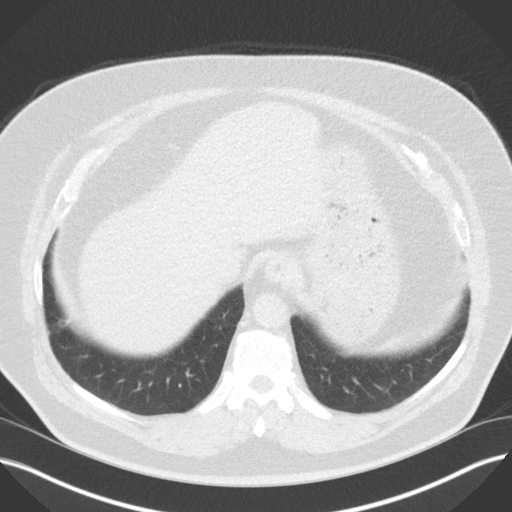
[im 19/71  lung]
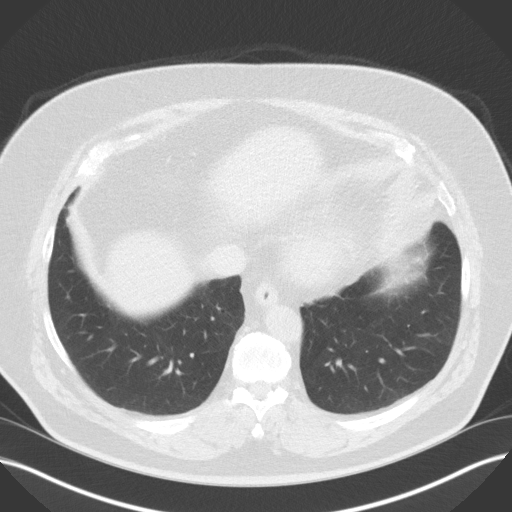
[im 24/71  mediastinal]
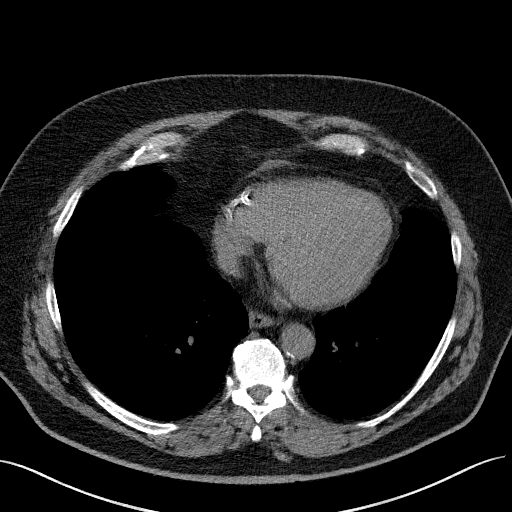
[im 24/71  lung]
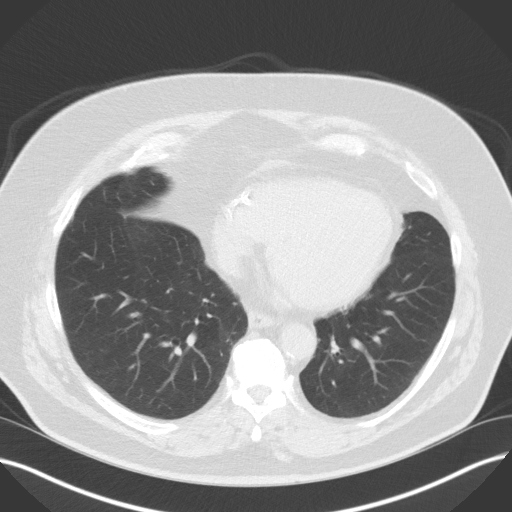
[im 29/71  lung]
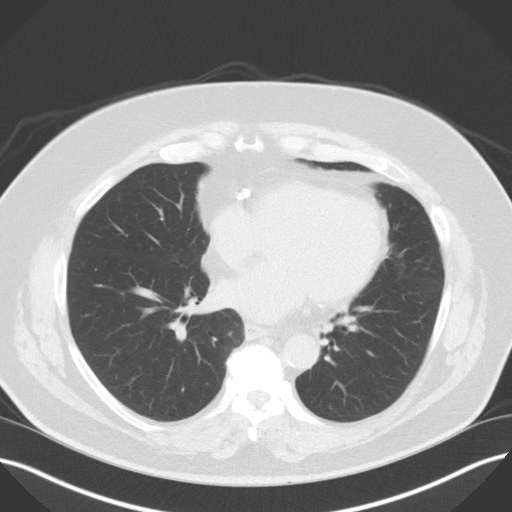
[im 34/71  lung]
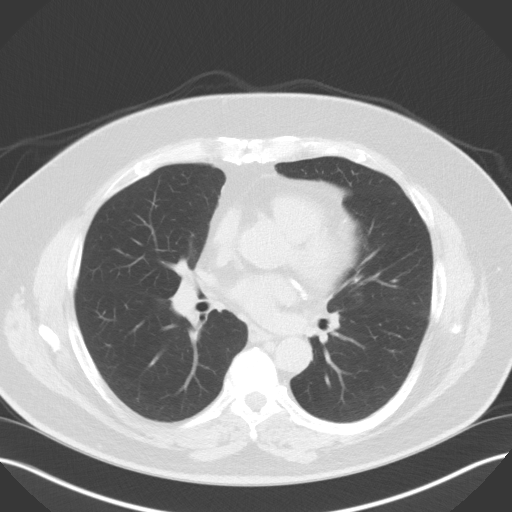
[im 38/71  lung]
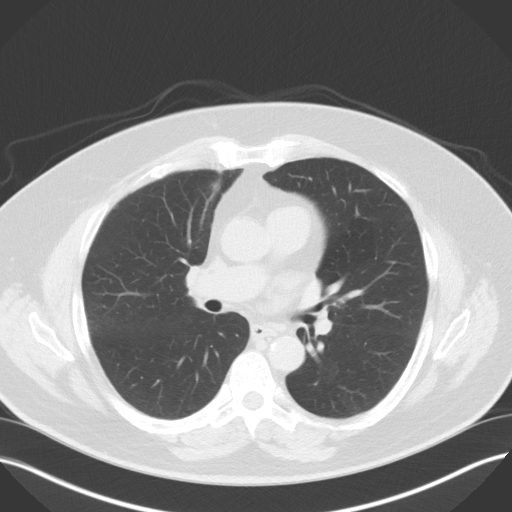
[im 42/71  mediastinal]
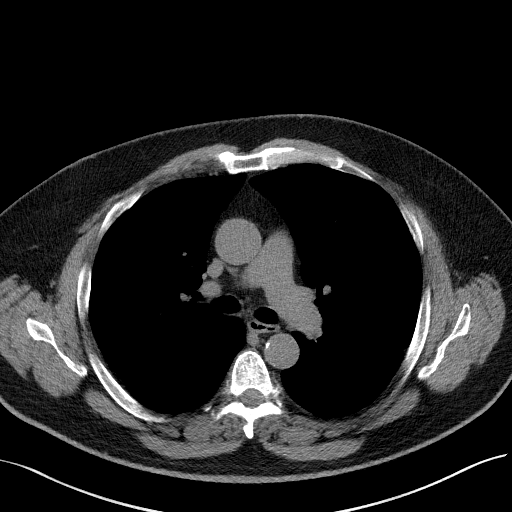
[im 42/71  lung]
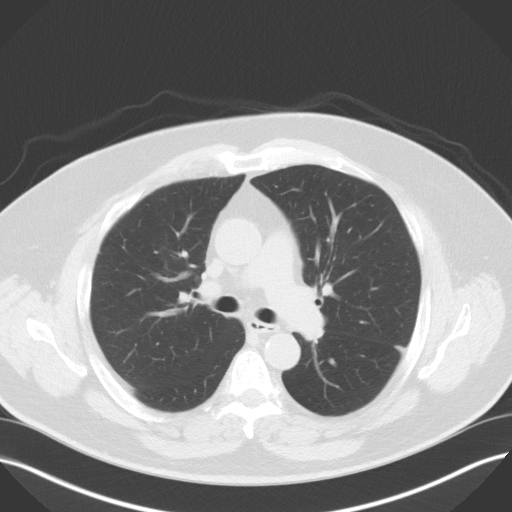
[im 45/71  lung]
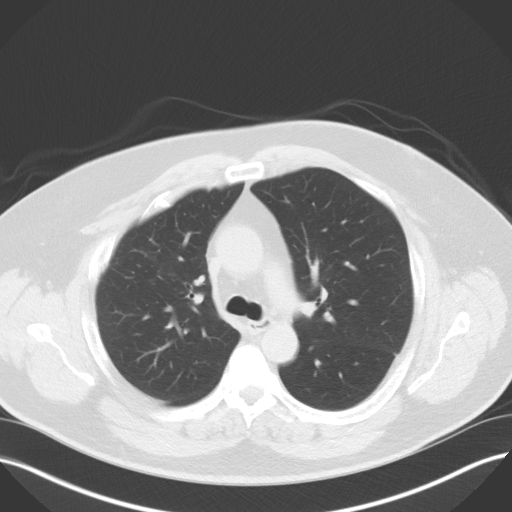
[im 50/71  lung]
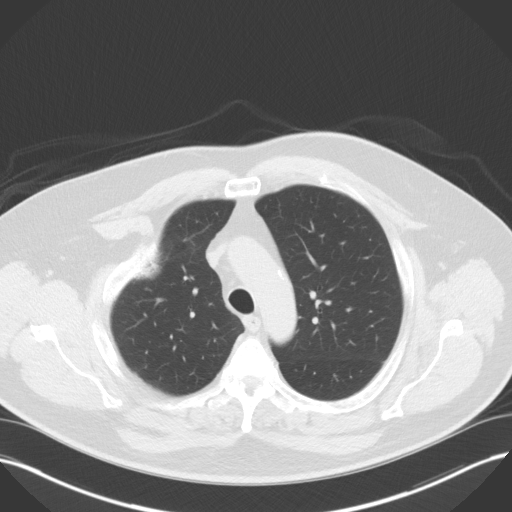
[im 55/71  lung]
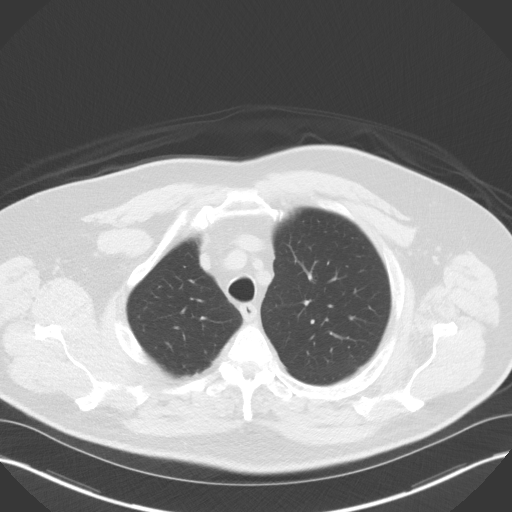
[im 58/71  mediastinal]
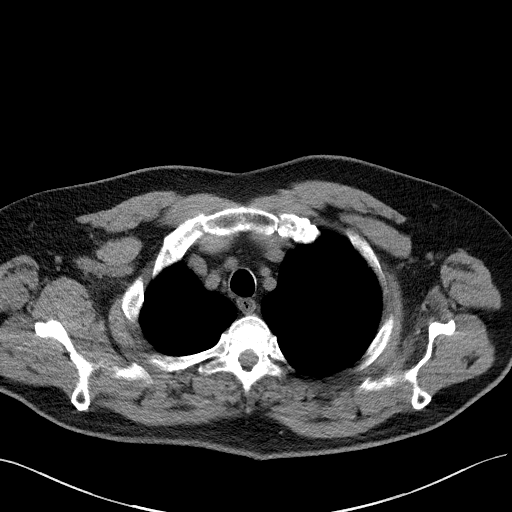
[im 58/71  lung]
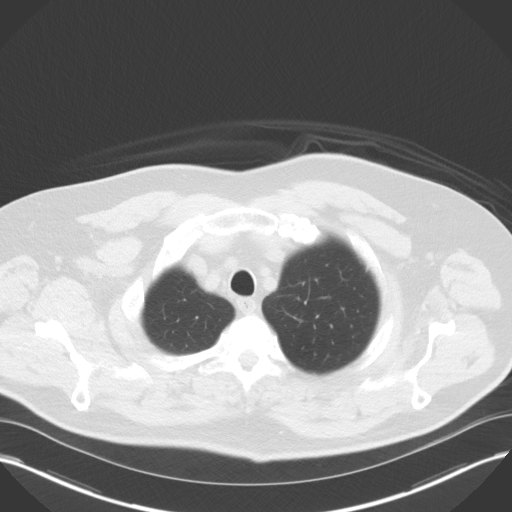
[im 63/71  lung]
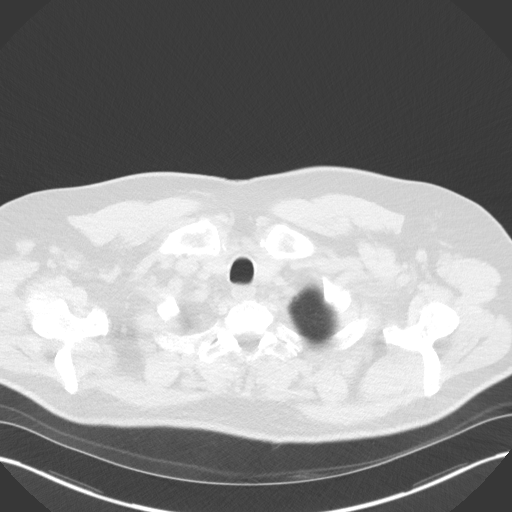
[im 68/71  lung]
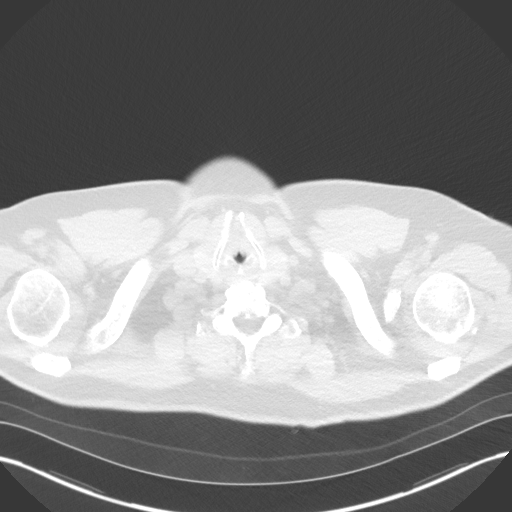

[15 of 32 positions shown; findings below may reference images not displayed]

FINDINGS: Cardiovascular: The heart size is normal. No substantial pericardial
effusion. Coronary artery calcification is evident. Mild
atherosclerotic calcification is noted in the wall of the thoracic
aorta.

Mediastinum/Nodes: No mediastinal lymphadenopathy. No evidence for
gross hilar lymphadenopathy although assessment is limited by the
lack of intravenous contrast on the current study. The esophagus has
normal imaging features. There is no axillary lymphadenopathy.

Lungs/Pleura: Centrilobular emphsyema noted. Tiny left upper lobe
pulmonary nodule identified previously is stable in the interval. No
new suspicious pulmonary nodule or mass. No focal airspace
consolidation. No pleural effusion.

Upper Abdomen: Unremarkable.

Musculoskeletal: No worrisome lytic or sclerotic osseous
abnormality. Stable deformity anterior right ribs.
IMPRESSION: 1. Lung-RADS 2, benign appearance or behavior. Continue annual
screening with low-dose chest CT without contrast in 12 months.
2.  Emphysema ([id number]) and Aortic Atherosclerosis (9IPZO-170.0)

## 2023-06-15 ENCOUNTER — Other Ambulatory Visit: Payer: Self-pay | Admitting: Orthopedic Surgery

## 2023-06-15 DIAGNOSIS — M67912 Unspecified disorder of synovium and tendon, left shoulder: Secondary | ICD-10-CM

## 2023-06-15 DIAGNOSIS — M7532 Calcific tendinitis of left shoulder: Secondary | ICD-10-CM

## 2023-06-25 ENCOUNTER — Ambulatory Visit
Admission: RE | Admit: 2023-06-25 | Discharge: 2023-06-25 | Disposition: A | Discharge status: Home / Self Care | Payer: 59 | Source: Ambulatory Visit | Attending: Orthopedic Surgery | Admitting: Orthopedic Surgery

## 2023-06-25 DIAGNOSIS — M7532 Calcific tendinitis of left shoulder: Secondary | ICD-10-CM

## 2023-06-25 DIAGNOSIS — M67912 Unspecified disorder of synovium and tendon, left shoulder: Secondary | ICD-10-CM

## 2023-11-02 ENCOUNTER — Ambulatory Visit
Admission: RE | Admit: 2023-11-02 | Discharge: 2023-11-02 | Disposition: A | Discharge status: Home / Self Care | Payer: 59 | Source: Ambulatory Visit | Attending: Acute Care | Admitting: Acute Care

## 2023-11-02 DIAGNOSIS — Z87891 Personal history of nicotine dependence: Secondary | ICD-10-CM | POA: Diagnosis present

## 2023-11-02 DIAGNOSIS — Z122 Encounter for screening for malignant neoplasm of respiratory organs: Secondary | ICD-10-CM | POA: Insufficient documentation

## 2023-11-22 ENCOUNTER — Other Ambulatory Visit: Payer: Self-pay | Admitting: Orthopedic Surgery

## 2023-11-30 ENCOUNTER — Other Ambulatory Visit: Payer: Self-pay

## 2023-11-30 DIAGNOSIS — Z87891 Personal history of nicotine dependence: Secondary | ICD-10-CM

## 2023-11-30 DIAGNOSIS — Z122 Encounter for screening for malignant neoplasm of respiratory organs: Secondary | ICD-10-CM

## 2023-12-14 ENCOUNTER — Other Ambulatory Visit: Payer: Self-pay | Admitting: Orthopedic Surgery

## 2023-12-17 ENCOUNTER — Encounter
Admission: RE | Admit: 2023-12-17 | Discharge: 2023-12-17 | Disposition: A | Discharge status: Home / Self Care | Payer: 59 | Source: Ambulatory Visit | Attending: Orthopedic Surgery | Admitting: Orthopedic Surgery

## 2023-12-17 ENCOUNTER — Other Ambulatory Visit: Payer: Self-pay

## 2023-12-17 ENCOUNTER — Encounter: Payer: Self-pay | Admitting: Orthopedic Surgery

## 2023-12-17 VITALS — Ht 72.0 in | Wt 235.0 lb

## 2023-12-17 DIAGNOSIS — Z01812 Encounter for preprocedural laboratory examination: Secondary | ICD-10-CM | POA: Insufficient documentation

## 2023-12-17 DIAGNOSIS — I200 Unstable angina: Secondary | ICD-10-CM | POA: Diagnosis not present

## 2023-12-17 DIAGNOSIS — Z794 Long term (current) use of insulin: Secondary | ICD-10-CM | POA: Insufficient documentation

## 2023-12-17 DIAGNOSIS — I252 Old myocardial infarction: Secondary | ICD-10-CM | POA: Insufficient documentation

## 2023-12-17 DIAGNOSIS — I214 Non-ST elevation (NSTEMI) myocardial infarction: Secondary | ICD-10-CM

## 2023-12-17 DIAGNOSIS — E119 Type 2 diabetes mellitus without complications: Secondary | ICD-10-CM | POA: Diagnosis not present

## 2023-12-17 HISTORY — DX: Hyperlipidemia, unspecified: E78.5

## 2023-12-17 HISTORY — DX: Other specified diabetes mellitus with diabetic neuropathy, unspecified: E13.40

## 2023-12-17 HISTORY — DX: Other specified diabetes mellitus with unspecified diabetic retinopathy without macular edema: E13.319

## 2023-12-17 HISTORY — DX: Gastric ulcer, unspecified as acute or chronic, without hemorrhage or perforation: K25.9

## 2023-12-17 HISTORY — DX: Emphysema, unspecified: J43.9

## 2023-12-17 LAB — BASIC METABOLIC PANEL
Anion gap: 8 (ref 5–15)
BUN: 20 mg/dL (ref 8–23)
CO2: 26 mmol/L (ref 22–32)
Calcium: 9 mg/dL (ref 8.9–10.3)
Chloride: 102 mmol/L (ref 98–111)
Creatinine, Ser: 0.85 mg/dL (ref 0.61–1.24)
GFR, Estimated: >60 mL/min (ref >60)
Glucose, Bld: 125 mg/dL — ABNORMAL HIGH (ref 70–99)
Potassium: 4.6 mmol/L (ref 3.5–5.1)
Sodium: 136 mmol/L (ref 135–145)

## 2023-12-17 LAB — CBC
HCT: 38.3 % — ABNORMAL LOW (ref 39.0–52.0)
Hemoglobin: 13 g/dL (ref 13.0–17.0)
MCH: 28.6 pg (ref 26.0–34.0)
MCHC: 33.9 g/dL (ref 30.0–36.0)
MCV: 84.2 fL (ref 80.0–100.0)
Platelets: 263 10*3/uL (ref 150–400)
RBC: 4.55 MIL/uL (ref 4.22–5.81)
RDW: 12.6 % (ref 11.5–15.5)
WBC: 4.3 10*3/uL (ref 4.0–10.5)
nRBC: 0 % (ref 0.0–0.2)

## 2023-12-17 NOTE — Pre-Procedure Instructions (Addendum)
Consult with cardiology regarding pt's ability to hold aspirin prior to shoulder surgery. Pt to hold aspirin 5 days as requested by ortho

## 2023-12-17 NOTE — Progress Notes (Signed)
Perioperative / Anesthesia Services  Pre-Admission Testing Clinical Review / Pre-Operative Anesthesia Consult  Date: 12/24/23  Patient Demographics:  Name: Marc Blair DOB: [birth year] MRN:   [id number]  Planned Surgical Procedure(s):    Case: [id number] Date/Time: 12/31/23 1245   Procedure: Left arthroscopic subscapularis and supraspinatus repair, calcific deposit excision, and distal clavicle excision (Left: Shoulder)   Anesthesia type: Choice   Pre-op diagnosis:      Dysfunction of left rotator cuff M67.912     Calcific tendonitis of left shoulder M75.32   Location: [facility] OR ROOM 08 / [facility]   Surgeons: Signa Kell, MD      NOTE: Available PAT nursing documentation and vital signs have been reviewed. Clinical nursing staff has updated patient's PMH/PSHx, current medication list, and drug allergies/intolerances to ensure comprehensive history available to assist in medical decision making as it pertains to the aforementioned surgical procedure and anticipated anesthetic course. Extensive review of available clinical information personally performed. [place] PMH and PSHx updated with any diagnoses/procedures that  may have been inadvertently omitted during his intake with the pre-admission testing department's nursing staff.  Clinical Discussion:  Marc Blair is a 67 y.o. male who is submitted for pre-surgical anesthesia review and clearance prior to him undergoing the above procedure. Patient is a Former Smoker (quit 03/2015). Pertinent PMH includes: CAD, MI x 2, ischemic cardiomyopathy, HFrEF, chronic cerebral microvascular disease, aortic atherosclerosis, HTN, HLD, T2DM, emphysema, deviated nasal septum, OSAH (requires nocturnal PAP therapy), GERD (on daily PPI), gastric ulcer, OA, cervical DJD, calcific LEFT rotator cuff tendinitis, diabetic polyneuropathy.  Patient is followed by cardiology (Custovic, DO). He was last seen in the cardiology clinic on  10/19/2023; notes reviewed. At the time of his clinic visit, patient doing well overall from a cardiovascular perspective. Patient denied any chest pain, shortness of breath, PND, orthopnea, palpitations, significant peripheral edema, weakness, fatigue, vertiginous symptoms, or presyncope/syncope. Patient with a past medical history significant for cardiovascular diagnoses. Documented physical exam was grossly benign, providing no evidence of acute exacerbation and/or decompensation of the patient's known cardiovascular conditions. Of note, complete records regarding patient's complete cardiovascular history are unavailable at time of consult. Information gathered from patient/family report, as well as from notes provided by patient's local care providers.   Patient reported to have suffer an MI (unknown type) back in 2002. He subsequently underwent PCI with stent placement (unknown type) to the [facility].   Patient suffered an NSTEMI on 12/05/2015. Troponins were trended: 0.06 --> 0.28 --> 0.78 --> 1.01 ng/mL.   TTE performed on 12/05/2023 revealing mildly reduced left ventricular systolic function with an EF of 40-45%. There were no regional wall motion abnormalities. Right ventricular size and function was norma. Mild mitral and tricuspid valve regurgitation was observed. All transvalvular gradients were noted to be normal providing no evidence suggestive of valvular stenosis. Aorta normal in size with no evidence of aneurysmal dilatation.  Patient underwent diagnostic LEFT heart catheterization on 12/06/2015 revealing multivessel CAD; 100% OM1, 95% ISR mRCA-1, 50% mRCA-2, 30% dRCA-1, 30% dRCA-2, 40% pLCx, and 40% mLAD. PCI was performed placing a 3.0 x 33 mm Xience Alpine DES x 1 to the mRCA-1 lesion. Procedure yielded excellent angiographic result and TIMI-3 flow.  Most recent TTE was performed on 07/29/2021 revealing low normal left ventricular systolic function with and EF of 50%. There was anterior,  later, and apical hypokinesis. Moderate concentric LVH noted. Left ventricular diastolic Doppler parameters consistent with abnormal relaxation (G1DD). Left atrium was  mildly dilated. Trivial tricuspid and pulmonary regurgitation noted. All transvalvular gradients were noted to be normal providing no evidence suggestive of valvular stenosis. Aorta normal in size with no evidence of aneurysmal dilatation.   Blood pressure well controlled at 110/62 mmHg on currently prescribed beta blocker (atenolol) and ACEi (lisinopril) therapies.  Patient is on rosuvastatin + Omega-3 fatty acid for his HLD diagnosis and ASCVD prevention. T2DM reasonably controlled on currently prescribed regimen when seen by cardiology; last HgbA1c was 7.3% when checked on 02/16/2023. Of note, since patient was last seen by cardiology, A1c level has been rechecked with further improvement noted to 6.6% on 11/05/2023. He does have an OSAH diagnosis and is reported to be compliant with prescribed nocturnal PAP therapy. Patient is able to complete all of his  ADL/IADLs without cardiovascular limitation.  Per the [facility], patient is able to achieve at least 4 METS of physical activity without experiencing any significant degree of angina/anginal equivalent symptoms. No changes were made to his medication regimen during his visit with cardiology.  Patient scheduled to follow-up with outpatient cardiology in 1 year or sooner if needed.  [name] is scheduled for an elective LEFT ARTHROSCOPIC SUBSCAPULARIS AND SUPRASPINATUS REPAIR, CALCIFIC DEPOSIT EXCISION, AND DISTAL CLAVICLE EXCISION (LEFT: SHOULDER) on 01/103/2025 with Dr. Signa Kell, MD. Given patient's past medical history significant for cardiovascular diagnoses, presurgical cardiac clearance was sought by the PAT team. Per cardiology, "this patient is optimized for surgery and may proceed with the planned procedural course with a MODERATE risk of significant perioperative cardiovascular  complications".  In review of the patient's chart, it is noted that he is on daily oral antithrombotic therapy. He has been instructed on recommendations for holding his daily low dose ASA  for 5 days prior to his procedure with plans to restart as soon as postoperative bleeding risk felt to be minimized by his attending surgeon. The patient has been instructed that his last dose of ASA should be on 12/25/2023.  Patient denies previous perioperative complications with anesthesia in the past. In review his EMR, it is noted that patient underwent a general anesthetic course here at [facility] (ASA III) in 06/2020 without documented complications.      12/17/2023    9:39 AM 07/19/2020    9:13 AM 07/19/2020    9:03 AM  Vitals with BMI  Height 6\' 0"     Weight 235 lbs    BMI 31.86    Systolic  107   Diastolic  72   Pulse  52 50   Providers/Specialists:  NOTE: Primary physician provider listed below. Patient may have been seen by APP or partner within same practice.   PROVIDER ROLE / SPECIALTY LAST Sarina Ser, MD Orthopedics (Surgeon) 12/16/2023  Gauger, Hermenia Fiscal, NP Primary Care Provider 11/05/2023  Clotilde Dieter, DO Cardiology 10/19/2023   Allergies:   Allergies  Allergen Reactions   Sudafed [Pseudoephedrine] Other (See Comments)    Weakness, Diaphoresis   Lipitor [Atorvastatin] Other (See Comments)    "FEELS LIKE SOMEONE BEAT THE DAYLIGHTS OUT OF ME"   Current Home Medications:   No current facility-administered medications for this encounter.    ascorbic acid (VITAMIN C) 1000 MG tablet   aspirin EC 81 MG tablet   atenolol (TENORMIN) 50 MG tablet   b complex vitamins capsule   Black Elderberry 1000 MG CAPS   calcium carbonate (OSCAL) 1500 (600 Ca) MG TABS tablet   Celery Seed OIL   diclofenac (  VOLTAREN) 75 MG EC tablet   ferrous sulfate 325 (65 FE) MG tablet   gabapentin (NEURONTIN) 300 MG capsule   insulin glargine (LANTUS)  100 UNIT/ML injection   lisinopril (ZESTRIL) 10 MG tablet   magnesium gluconate (MAGONATE) 500 MG tablet   metFORMIN (GLUCOPHAGE) 1000 MG tablet   methocarbamol (ROBAXIN) 500 MG tablet   Misc Natural Products (BEET ROOT PO)   Misc Natural Products (WHITE WILLOW BARK PO)   Multiple Vitamin (MULTIVITAMIN WITH MINERALS) TABS tablet   NON FORMULARY   Omega-3 Fatty Acids (FISH OIL PO)   omeprazole (PRILOSEC OTC) 20 MG tablet   OZEMPIC, 2 MG/DOSE, 8 MG/3ML SOPN   Potassium Gluconate 550 MG TABS   rosuvastatin (CRESTOR) 20 MG tablet   TURMERIC PO   vitamin E (VITAMIN E) 400 UNIT capsule   History:   Past Medical History:  Diagnosis Date   Aortic atherosclerosis (HCC)    Calcific tendinitis of left shoulder    Cerebral microvascular disease    CHF (congestive heart failure) (HCC)    a.) TTE 12/05/2015: EF 40-45%, no RWMAs, norm RVSF, mild MR/TR; b.) TTE 07/29/2021: EF 50%, ant/lat/apical HK, mod LVH, G1DD, mild LAE, norm RVSF, triv MR/TR/PR   Coronary artery disease 2002   a.) s/p MI 2000 (unknown type) with PCI/stenting mRCA (unknown stent type); b.) s/p NSTEMI 12/05/2015 --> LHC/PCI 100% OM1, 95% ISR mRCA-1 (3.0 x 33 mm Xience Alpine DES), 50% mRCA-2, 30% dRCA-1, 30% dRCA-2, 40% pLCx, 40% mLAD   Deviated septum    DJD (degenerative joint disease), cervical    Dysfunction of left rotator cuff    Emphysema lung (HCC)    Emphysema of lung (HCC)    Gastric ulcer    in early 20's   GERD (gastroesophageal reflux disease)    HLD (hyperlipidemia)    Hypertension    Ischemic cardiomyopathy    a.) TTE 12/05/2015: EF 40-45%; b.) TTE 07/29/2021: EF 50%   Left carpal tunnel syndrome    Long term current use of aspirin    MI (myocardial infarction) (HCC) 2002   a.) MI type unknown; LHC/PCI mRCA (stent type unknown)   Neuropathy due to secondary diabetes (HCC)    NSTEMI (non-ST elevated myocardial infarction) (HCC) 12/05/2015   a.) troponins were trended: 0.06 --> 0.28 --> 0.78 --> 1.01  ng/mL; b.) LHC/PCI: 100% OM1, 95% ISR mRCA-1, 50% mRCA-2, 30% dRCA-1, 30% dRCA-2, 40% pLCx, 40% mLAD   Obesity    OSA on CPAP    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)    Type 2 diabetes mellitus with diabetic polyneuropathy, with long-term current use of insulin (HCC)    Past Surgical History:  Procedure Laterality Date   CARDIAC CATHETERIZATION N/A 12/06/2015   Procedure: Left Heart Cath and Coronary Angiography;  Surgeon: Marcina Millard, MD;  Location: [facility] INVASIVE CV LAB;  Service: Cardiovascular;  Laterality: N/A;   CARDIAC CATHETERIZATION N/A 12/06/2015   Procedure: Coronary Stent Intervention;  Surgeon: Marcina Millard, MD;  Location: [facility] INVASIVE CV LAB;  Service: Cardiovascular;  Laterality: N/A;   COLONOSCOPY WITH PROPOFOL N/A 07/19/2020   Procedure: COLONOSCOPY WITH PROPOFOL;  Surgeon: Regis Bill, MD;  Location: [facility] ENDOSCOPY;  Service: Endoscopy;  Laterality: N/A;   CORONARY ANGIOPLASTY     2002, 2016   CORONARY ANGIOPLASTY WITH STENT PLACEMENT Left 2002   Procedure: CORONARY ANGIOPLASTY WITH STENT PLACEMENT (mid RCA); Location: [place]   MULTIPLE TOOTH EXTRACTIONS     No family history on file. Social  History   Tobacco Use   Smoking status: Former    Current packs/day: 0.00    Types: Cigarettes    Quit date: 04/05/2015    Years since quitting: 8.7   Smokeless tobacco: Never   Tobacco comments:    Last quit 2018  Substance Use Topics   Alcohol use: Not on file    Comment: OCCASIONAL BEER   Pertinent Clinical Results:  LABS:  Hospital Outpatient Visit on 12/17/2023  Component Date Value Ref Range Status   WBC 12/17/2023 4.3  4.0 - 10.5 K/uL Final   RBC 12/17/2023 4.55  4.22 - 5.81 MIL/uL Final   Hemoglobin 12/17/2023 13.0  13.0 - 17.0 g/dL Final   HCT 91/47/8295 38.3 (L)  39.0 - 52.0 % Final   MCV 12/17/2023 84.2  80.0 - 100.0 fL Final   MCH 12/17/2023 28.6  26.0 - 34.0 pg Final   MCHC 12/17/2023 33.9  30.0 - 36.0 g/dL Final    RDW 62/13/0865 12.6  11.5 - 15.5 % Final   Platelets 12/17/2023 263  150 - 400 K/uL Final   nRBC 12/17/2023 0.0  0.0 - 0.2 % Final   Performed at [facility], [address] [place], [place] [address]   Sodium 12/17/2023 136  135 - 145 mmol/L Final   Potassium 12/17/2023 4.6  3.5 - 5.1 mmol/L Final   Chloride 12/17/2023 102  98 - 111 mmol/L Final   CO2 12/17/2023 26  22 - 32 mmol/L Final   Glucose, Bld 12/17/2023 125 (H)  70 - 99 mg/dL Final   Glucose reference range applies only to samples taken after fasting for at least 8 hours.   BUN 12/17/2023 20  8 - 23 mg/dL Final   Creatinine, Ser 12/17/2023 0.85  0.61 - 1.24 mg/dL Final   Calcium 62/95/2841 9.0  8.9 - 10.3 mg/dL Final   GFR, Estimated 12/17/2023 >60  >60 mL/min Final   Comment: (NOTE) Calculated using the CKD-EPI Creatinine Equation (2021)    Anion gap 12/17/2023 8  5 - 15 Final   Performed at [facility], [address] [place], [place] [address]    ECG: Date: 10/19/2023  Time ECG obtained: 1129 AM Rate: 74 bpm Rhythm: normal sinus Axis (leads I and aVF): normal Intervals: PR 158 ms. QRS 104 ms. QTc 408 ms. ST segment and T wave changes: No evidence of acute T wave abnormalities or significant ST segment elevation or depression.  Comparison: Similar to previous tracing obtained on 12/08/2022 NOTE: Tracing obtained at [facility]; unable for review. Above based on cardiologist's interpretation.    IMAGING / PROCEDURES: CT CHEST LUNG CA SCREEN LOW DOSE W/O CM performed on 11/02/2023 Lung-RADS 2, benign appearance or behavior. Continue annual screening with low-dose chest CT without contrast in 12 months. Coronary artery calcifications. Aortic atherosclerosis  Emphysema   MR SHOULDER LEFT WO CONTRAST performed on 06/25/2023 Moderate to high-grade tendinosis of the entire supraspinatus and anterior infraspinatus tendon footprints greater than the critical zone of the tendons. Tiny  interstitial tear within the mid to anterior supraspinatus tendon footprint. Calcification suggesting chronic calcific tendinosis of the anterior infraspinatus tendon footprint. High-grade partial-thickness articular sided tear of the superior 11 mm of the subscapularis tendon insertion. Moderate to high-grade subscapularis muscle atrophy. Likely proximal long head of the biceps tendon rupture with distal tendon retraction. Mild-to-moderate degenerative changes of the acromioclavicular joint. Mild subacromial/subdeltoid bursitis.  DIAGNOSTIC RADIOGRAPHS OF LEFT SHOULDER performed on 06/15/2023 Left shoulder with calcific tendinopathy involving  the supraspinatus region.   There is no high riding humeral head.   There is very minimal inferior glenoid osteophyte formation.   The [facility] joint shows no degenerative narrowing   TRANSTHORACIC ECHOCARDIOGRAM performed on 07/29/2021 Low normal left ventricular systolic function with an EF of 50% Anterior, lateral, and apical hypokinesis Moderate concentric LVH Left ventricular diastolic Doppler parameters consistent with abnormal relaxation (G1DD). Mild LAE Normal right ventricular systolic function Trivial mitral, tricuspid, and pulmonary valve regurgitation Normal transvalvular gradients; no valvular stenosis No pericardial effusion  LEFT HEART CATHETERIZATION AND CORONARY ANGIOGRAPHY performed on 12/06/2015 Two-vessel coronary artery disease with chronically occluded OM1 and high-grade in-stent restenosis mid RC 1st Mrg lesion, 100% stenosed Mid RCA-2 lesion, 50% stenosed Mid RCA-1 lesion, 95% stenosed. Post intervention, there is a 0% residual stenosis. Dist RCA-1 lesion, 30% stenosed. Dist RCA-2 lesion, 30% stenosed. Prox Cx lesion, 40% stenosed. Mid LAD lesion, 40% stenosed. Moderately reduced left ventricular function with inferior wall akinesis Successful PCI with 3.0 x 33 mm Xience Alpine stent mid RCA     Impression and Plan:   DIANNA BREHM has been referred for pre-anesthesia review and clearance prior to him undergoing the planned anesthetic and procedural courses. Available labs, pertinent testing, and imaging results were personally reviewed by me in preparation for upcoming operative/procedural course. [facility] medical record has been updated following extensive record review and patient interview with PAT staff.   This patient has been appropriately cleared by cardiology with an overall MODERATE risk of experiencing significant perioperative cardiovascular complications. Based on clinical review performed today (12/24/23), barring any significant acute changes in the patient's overall condition, it is anticipated that he will be able to proceed with the planned surgical intervention. Any acute changes in clinical condition may necessitate his procedure being postponed and/or cancelled. Patient will meet with anesthesia team (MD and/or CRNA) on the day of his procedure for preoperative evaluation/assessment. Questions regarding anesthetic course will be fielded at that time.   Pre-surgical instructions were reviewed with the patient during his PAT appointment, and questions were fielded to satisfaction by PAT clinical staff. He has been instructed on which medications that he will need to hold prior to surgery, as well as the ones that have been deemed safe/appropriate to take on the day of his procedure. As part of the general education provided by PAT, patient made aware both verbally and in writing, that he would need to abstain from the use of any illegal substances during his perioperative course. He was advised that failure to follow the provided instructions could necessitate case cancellation or result in serious perioperative complications up to and including death. Patient encouraged to contact PAT and/or his surgeon's office to discuss any questions or concerns that may arise prior to surgery; verbalized  understanding.   Quentin Mulling, MSN, APRN, FNP-C, CEN [facility]  Perioperative Services Nurse Practitioner Phone: [phone] Fax: [phone] 12/24/23 11:36 AM  NOTE: This note has been prepared using Dragon dictation software. Despite my best ability to proofread, there is always the potential that unintentional transcriptional errors may still occur from this process.

## 2023-12-17 NOTE — Patient Instructions (Addendum)
Your procedure is scheduled on: Friday 12/31/23 To find out your arrival time, please call [phone] between 1PM - 3PM on:   Thursday 12/29/22 Report to the Registration Desk on the 1st floor of the [facility]. FREE Valet parking is available.  If your arrival time is 6:00 am, do not arrive before that time as the [facility] entrance doors do not open until 6:00 am.  REMEMBER: Instructions that are not followed completely may result in serious medical risk, up to and including death; or upon the discretion of your surgeon and anesthesiologist your surgery may need to be rescheduled.  Do not eat food after midnight the night before surgery.  No gum chewing or hard candies.  You may however, drink CLEAR liquids up to 2 hours before you are scheduled to arrive for your surgery. Do not drink anything within 2 hours of your scheduled arrival time.  Clear liquids include: - water   Type 1 and Type 2 diabetics should only drink water.  In addition, your doctor has ordered for you to drink the provided:   Gatorade G2 Drinking this carbohydrate drink up to two hours before surgery helps to reduce insulin resistance and improve patient outcomes. Please complete drinking 2 hours before scheduled arrival time.  One week prior to surgery: Stop Anti-inflammatories (NSAIDS) such as Advil, Aleve, Ibuprofen, Motrin, Naproxen, Naprosyn and Aspirin based products such as Excedrin, Goody's Powder, BC Powder. You do not have to stop your diclofenac (VOLTAREN)  You may however, continue to take Tylenol if needed for pain up until the day of surgery.  Stop ALL OVER THE COUNTER supplements and vitamins for 7 days until after surgery.  Continue taking all prescribed medications with the exception of the following: Ozempic , hold for 7 days. Last dose will be Wednesday 12/25. Asprirn , hold 5 days. Last dose Saturday 12/25/23. Metformin, hold for 2 days. Last dose will be Tuesday 12/28/23.  **Follow  guidelines for insulin and diabetes medications** NO INSULIN ON THE MORNING OF SURGERY!!  TAKE ONLY THESE MEDICATIONS THE MORNING OF SURGERY WITH A SIP OF WATER:  atenolol (TENORMIN)  omeprazole (PRILOSEC OTC) Antacid (take one the night before and one on the morning of surgery - helps to prevent nausea after surgery.) rosuvastatin (CRESTOR) 20 MG tablet   No Alcohol for 24 hours before or after surgery.  No Smoking including e-cigarettes for 24 hours before surgery.  No chewable tobacco products for at least 6 hours before surgery.  No nicotine patches on the day of surgery.  Do not use any "recreational" drugs for at least a week (preferably 2 weeks) before your surgery.  Please be advised that the combination of cocaine and anesthesia may have negative outcomes, up to and including death. If you test positive for cocaine, your surgery will be cancelled.  On the morning of surgery brush your teeth with toothpaste and water, you may rinse your mouth with mouthwash if you wish. Do not swallow any toothpaste or mouthwash.  Use CHG Soap or wipes as directed on instruction sheet. sEE BELOW  Do not wear lotions, powders, or perfumes. NO DEODORANT  Do not shave body hair from the neck down 48 hours before surgery.  Wear CLEAN comfortable clothing (specific to your surgery type) to the hospital.  Do not wear jewelry, make-up, hairpins, clips or nail polish.  For welded (permanent) jewelry: bracelets, anklets, waist bands, etc.  Please have this removed prior to surgery.  If it is not removed,  there is a chance that hospital personnel will need to cut it off on the day of surgery. Contact lenses, hearing aids and dentures may not be worn into surgery.  Bring your C-PAP to the hospital in case you may have to spend the night. You may leave in car  Do not bring valuables to the hospital. [facility] is not responsible for any missing/lost belongings or valuables.   Notify your doctor if  there is any change in your medical condition (cold, fever, infection).  If you are being discharged the day of surgery, you will not be allowed to drive home. You will need a responsible individual to drive you home and stay with you for 24 hours after surgery.   If you are taking public transportation, you will need to have a responsible individual with you.  If you are being admitted to the hospital overnight, leave your suitcase in the car. After surgery it may be brought to your room.  In case of increased patient census, it may be necessary for you, the patient, to continue your postoperative care in the Same Day Surgery department.  After surgery, you can help prevent lung complications by doing breathing exercises.  Take deep breaths and cough every 1-2 hours. Your doctor may order a device called an Incentive Spirometer to help you take deep breaths. When coughing or sneezing, hold a pillow firmly against your incision with both hands. This is called "splinting." Doing this helps protect your incision. It also decreases belly discomfort.  Surgery Visitation Policy:  Patients undergoing a surgery or procedure may have two family members or support persons with them as long as the person is not COVID-19 positive or experiencing its symptoms.   Inpatient Visitation:    Visiting hours are 7 a.m. to 8 p.m. Up to four visitors are allowed at one time in a patient room. The visitors may rotate out with other people during the day. One designated support person (adult) may remain overnight.  Please call the Pre-admissions Testing Dept. at [phone] if you have any questions about these instructions.     Preparing for Surgery with CHLORHEXIDINE GLUCONATE (CHG) Soap  Chlorhexidine Gluconate (CHG) Soap  o An antiseptic cleaner that kills germs and bonds with the skin to continue killing germs even after washing  o Used for showering the night before surgery and morning of  surgery  Before surgery, you can play an important role by reducing the number of germs on your skin.  CHG (Chlorhexidine gluconate) soap is an antiseptic cleanser which kills germs and bonds with the skin to continue killing germs even after washing.  Please do not use if you have an allergy to CHG or antibacterial soaps. If your skin becomes reddened/irritated stop using the CHG.  1. Shower the NIGHT BEFORE SURGERY and the MORNING OF SURGERY with CHG soap.  2. If you choose to wash your hair, wash your hair first as usual with your normal shampoo.  3. After shampooing, rinse your hair and body thoroughly to remove the shampoo.  4. Use CHG as you would any other liquid soap. You can apply CHG directly to the skin and wash gently with a scrungie or a clean washcloth.  5. Apply the CHG soap to your body only from the neck down. Do not use on open wounds or open sores. Avoid contact with your eyes, ears, mouth, and genitals (private parts). Wash face and genitals (private parts) with your normal soap.  6.  Wash thoroughly, paying special attention to the area where your surgery will be performed.  7. Thoroughly rinse your body with warm water.  8. Do not shower/wash with your normal soap after using and rinsing off the CHG soap.  9. Pat yourself dry with a clean towel.  10. Wear clean pajamas to bed the night before surgery.  12. Place clean sheets on your bed the night of your first shower and do not sleep with pets.  13. Shower again with the CHG soap on the day of surgery prior to arriving at the hospital.  14. Do not apply any deodorants/lotions/powders.  15. Please wear clean clothes to the hospital.

## 2023-12-20 NOTE — Progress Notes (Signed)
  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 In review of his medication reconciliation it was noted that patient is on a prescribed GLP-1 medication. Per guidelines issued by the American Society of Anesthesiologists (ASA), it is recommended that these medications be held for 7 days prior to the patient undergoing any type of elective surgical procedure. The patient is taking the following GLP-1 medication:  [x]  SEMAGLUTIDE   []  EXENATIDE  []  LIRAGLUTIDE   []  LIXISENATIDE  []  DULAGLUTIDE     []  TIRZEPATIDE (GLP-1/GIP)  Reached out to prescribing provider (Gauger, FNP-BC) to make them aware of the guidelines from anesthesia. Given that this patient takes the prescribed GLP-1 medication for his  diabetes diagnosis, rather than for weight loss, recommendations from the prescribing provider were solicited. Prescribing provider made aware of the following so that informed decision/POC can be developed for this patient that may be taking medications belonging to these drug classes:  Oral GLP-1 medications will be held 1 day prior to surgery.  Injectable GLP-1 medications will be held 7 days prior to surgery.  Metformin is routinely held 48 hours prior to surgery due to renal concerns, potential need for contrasted imaging  perioperatively, and the potential for tissue hypoxia leading to drug induced lactic acidosis.  All SGLT2i medications are held 72 hours prior to surgery as they can be associated with the increased potential for developing euglycemic diabetic ketoacidosis (EDKA).   Impression and Plan:  Marc Blair is on a prescribed GLP-1 medication, which induces the known side effect of decreased gastric emptying. Efforts are bring made to mitigate the risk of perioperative hyperglycemic events, as elevated blood glucose levels have been found to contribute to intra/postoperative complications. Additionally, hyperglycemic extremes can potentially necessitate the postponing of a patient's elective case in order to better optimize perioperative glycemic control, again with the aforementioned guidelines in place. With this in mind, recommendations have been sought from the prescribing provider, who has cleared patient to proceed with holding the prescribed GLP-1 as per the guidelines from the ASA.   Provider recommending: no further recommendations received from the prescribing provider.  Copy of signed clearance and recommendations placed on patient's chart for inclusion in their medical record and for review by the surgical/anesthetic team on the day of his procedure.   Quentin Mulling, MSN, APRN, FNP-C, CEN [facility]  Perioperative Services Nurse Practitioner Phone: [phone] Fax: [phone] 12/20/23 3:39 PM  NOTE: This note has been prepared using Dragon dictation software. Despite my best ability to proofread, there is always the potential that unintentional transcriptional errors may still occur from this process.

## 2023-12-24 ENCOUNTER — Other Ambulatory Visit: Payer: 59

## 2023-12-30 MED ORDER — SODIUM CHLORIDE 0.9 % IV SOLN
INTRAVENOUS | Status: DC
Start: 2023-12-30 — End: 2023-12-31

## 2023-12-30 MED ORDER — CEFAZOLIN SODIUM-DEXTROSE 2-4 GM/100ML-% IV SOLN
2.0000 g | INTRAVENOUS | Status: AC
  Administered 2023-12-31 (×2): 2 g via INTRAVENOUS

## 2023-12-30 MED ORDER — CHLORHEXIDINE GLUCONATE 0.12 % MT SOLN
15.0000 mL | Freq: Once | OROMUCOSAL | Status: AC
  Administered 2023-12-31: 15 mL via OROMUCOSAL

## 2023-12-30 MED ORDER — ORAL CARE MOUTH RINSE: 15.0000 mL | Freq: Once | OROMUCOSAL | Status: AC

## 2023-12-31 ENCOUNTER — Ambulatory Visit: Payer: 59 | Admitting: Urgent Care

## 2023-12-31 ENCOUNTER — Encounter
Admission: RE | Disposition: A | Discharge status: Home / Self Care | Payer: Self-pay | Source: Home / Self Care | Attending: Orthopedic Surgery

## 2023-12-31 ENCOUNTER — Ambulatory Visit
Admission: RE | Admit: 2023-12-31 | Discharge: 2023-12-31 | Disposition: A | Discharge status: Home / Self Care | Payer: 59 | Attending: Orthopedic Surgery | Admitting: Orthopedic Surgery

## 2023-12-31 ENCOUNTER — Encounter: Payer: Self-pay | Admitting: Orthopedic Surgery

## 2023-12-31 ENCOUNTER — Ambulatory Visit: Admit: 2023-12-31 | Payer: 59 | Admitting: Orthopedic Surgery

## 2023-12-31 ENCOUNTER — Ambulatory Visit: Payer: 59

## 2023-12-31 ENCOUNTER — Other Ambulatory Visit: Payer: Self-pay

## 2023-12-31 DIAGNOSIS — M75102 Unspecified rotator cuff tear or rupture of left shoulder, not specified as traumatic: Secondary | ICD-10-CM | POA: Diagnosis not present

## 2023-12-31 DIAGNOSIS — X58XXXA Exposure to other specified factors, initial encounter: Secondary | ICD-10-CM | POA: Insufficient documentation

## 2023-12-31 DIAGNOSIS — Z7985 Long-term (current) use of injectable non-insulin antidiabetic drugs: Secondary | ICD-10-CM | POA: Insufficient documentation

## 2023-12-31 DIAGNOSIS — S46212A Strain of muscle, fascia and tendon of other parts of biceps, left arm, initial encounter: Secondary | ICD-10-CM | POA: Diagnosis not present

## 2023-12-31 DIAGNOSIS — I2510 Atherosclerotic heart disease of native coronary artery without angina pectoris: Secondary | ICD-10-CM | POA: Diagnosis not present

## 2023-12-31 DIAGNOSIS — G4733 Obstructive sleep apnea (adult) (pediatric): Secondary | ICD-10-CM | POA: Insufficient documentation

## 2023-12-31 DIAGNOSIS — Z791 Long term (current) use of non-steroidal anti-inflammatories (NSAID): Secondary | ICD-10-CM | POA: Diagnosis not present

## 2023-12-31 DIAGNOSIS — I110 Hypertensive heart disease with heart failure: Secondary | ICD-10-CM | POA: Diagnosis not present

## 2023-12-31 DIAGNOSIS — E119 Type 2 diabetes mellitus without complications: Secondary | ICD-10-CM | POA: Diagnosis not present

## 2023-12-31 DIAGNOSIS — M19012 Primary osteoarthritis, left shoulder: Secondary | ICD-10-CM | POA: Diagnosis not present

## 2023-12-31 DIAGNOSIS — M25812 Other specified joint disorders, left shoulder: Secondary | ICD-10-CM | POA: Insufficient documentation

## 2023-12-31 DIAGNOSIS — Z955 Presence of coronary angioplasty implant and graft: Secondary | ICD-10-CM | POA: Insufficient documentation

## 2023-12-31 DIAGNOSIS — Z794 Long term (current) use of insulin: Secondary | ICD-10-CM | POA: Diagnosis not present

## 2023-12-31 HISTORY — DX: Carpal tunnel syndrome, left upper limb: G56.02

## 2023-12-31 HISTORY — DX: Deviated nasal septum: J34.2

## 2023-12-31 HISTORY — DX: Atherosclerosis of aorta: I70.0

## 2023-12-31 HISTORY — DX: Calcific tendinitis of left shoulder: M75.32

## 2023-12-31 HISTORY — DX: Unspecified disorder of synovium and tendon, left shoulder: M67.912

## 2023-12-31 HISTORY — DX: Type 2 diabetes mellitus with diabetic polyneuropathy: E11.42

## 2023-12-31 HISTORY — DX: Spondylosis without myelopathy or radiculopathy, cervical region: M47.812

## 2023-12-31 HISTORY — DX: Obstructive sleep apnea (adult) (pediatric): G47.33

## 2023-12-31 HISTORY — DX: Long term (current) use of aspirin: Z79.82

## 2023-12-31 HISTORY — PX: SHOULDER ARTHROSCOPY: SHX128

## 2023-12-31 HISTORY — DX: Ischemic cardiomyopathy: I25.5

## 2023-12-31 HISTORY — DX: Other cerebrovascular disease: I67.89

## 2023-12-31 HISTORY — DX: Emphysema, unspecified: J43.9

## 2023-12-31 LAB — GLUCOSE, CAPILLARY
Glucose-Capillary: 173 mg/dL — ABNORMAL HIGH (ref 70–99)
Glucose-Capillary: 188 mg/dL — ABNORMAL HIGH (ref 70–99)

## 2023-12-31 SURGERY — ARTHROSCOPY, SHOULDER
Anesthesia: Choice | Site: Shoulder | Laterality: Left

## 2023-12-31 SURGERY — ARTHROSCOPY, SHOULDER
Anesthesia: General | Site: Shoulder | Laterality: Left

## 2023-12-31 MED ORDER — DIPHENHYDRAMINE HCL 50 MG/ML IJ SOLN
INTRAMUSCULAR | Status: DC | PRN
  Administered 2023-12-31: 12.5 mg via INTRAVENOUS

## 2023-12-31 MED ORDER — LIDOCAINE HCL (PF) 1 % IJ SOLN
INTRAMUSCULAR | Status: AC
  Filled 2023-12-31: qty 5

## 2023-12-31 MED ORDER — PHENYLEPHRINE HCL-NACL 20-0.9 MG/250ML-% IV SOLN
INTRAVENOUS | Status: AC
  Filled 2023-12-31: qty 250

## 2023-12-31 MED ORDER — ONDANSETRON HCL 4 MG/2ML IJ SOLN
INTRAMUSCULAR | Status: AC
  Filled 2023-12-31: qty 2

## 2023-12-31 MED ORDER — MIDAZOLAM HCL 2 MG/2ML IJ SOLN
INTRAMUSCULAR | Status: AC
Start: 2023-12-31 — End: ?
  Filled 2023-12-31: qty 2

## 2023-12-31 MED ORDER — LIDOCAINE HCL (PF) 1 % IJ SOLN
INTRAMUSCULAR | Status: DC | PRN
Start: 2023-12-31 — End: 2023-12-31
  Administered 2023-12-31: 4 mL via SUBCUTANEOUS

## 2023-12-31 MED ORDER — ACETAMINOPHEN 500 MG PO TABS: 1000.0000 mg | ORAL_TABLET | Freq: Three times a day (TID) | ORAL | 2 refills | Status: DC

## 2023-12-31 MED ORDER — ACETAMINOPHEN 10 MG/ML IV SOLN
INTRAVENOUS | Status: AC
  Filled 2023-12-31: qty 100

## 2023-12-31 MED ORDER — ONDANSETRON HCL 4 MG/2ML IJ SOLN
INTRAMUSCULAR | Status: DC | PRN
  Administered 2023-12-31: 4 mg via INTRAVENOUS

## 2023-12-31 MED ORDER — ACETAMINOPHEN 10 MG/ML IV SOLN
INTRAVENOUS | Status: DC | PRN
  Administered 2023-12-31: 1000 mg via INTRAVENOUS

## 2023-12-31 MED ORDER — BUPIVACAINE HCL (PF) 0.5 % IJ SOLN
INTRAMUSCULAR | Status: AC
  Filled 2023-12-31: qty 10

## 2023-12-31 MED ORDER — SEVOFLURANE IN SOLN
RESPIRATORY_TRACT | Status: AC
Start: 2023-12-31 — End: ?
  Filled 2023-12-31: qty 250

## 2023-12-31 MED ORDER — PHENYLEPHRINE 80 MCG/ML (10ML) SYRINGE FOR IV PUSH (FOR BLOOD PRESSURE SUPPORT)
PREFILLED_SYRINGE | INTRAVENOUS | Status: AC
  Filled 2023-12-31: qty 10

## 2023-12-31 MED ORDER — BUPIVACAINE LIPOSOME 1.3 % IJ SUSP
INTRAMUSCULAR | Status: AC
  Filled 2023-12-31: qty 20

## 2023-12-31 MED ORDER — EPHEDRINE 5 MG/ML INJ
INTRAVENOUS | Status: AC
  Filled 2023-12-31: qty 5

## 2023-12-31 MED ORDER — PHENYLEPHRINE HCL-NACL 20-0.9 MG/250ML-% IV SOLN
INTRAVENOUS | Status: DC | PRN
  Administered 2023-12-31: 20 ug/min via INTRAVENOUS

## 2023-12-31 MED ORDER — PROPOFOL 10 MG/ML IV BOLUS
INTRAVENOUS | Status: DC | PRN
  Administered 2023-12-31: 130 mg via INTRAVENOUS

## 2023-12-31 MED ORDER — FENTANYL CITRATE (PF) 100 MCG/2ML IJ SOLN
INTRAMUSCULAR | Status: AC
  Filled 2023-12-31: qty 2

## 2023-12-31 MED ORDER — CEFAZOLIN SODIUM 1 G IJ SOLR
INTRAMUSCULAR | Status: AC
  Filled 2023-12-31: qty 20

## 2023-12-31 MED ORDER — SUGAMMADEX SODIUM 200 MG/2ML IV SOLN
INTRAVENOUS | Status: DC | PRN
  Administered 2023-12-31: 200 mg via INTRAVENOUS

## 2023-12-31 MED ORDER — FENTANYL CITRATE PF 50 MCG/ML IJ SOSY
50.0000 ug | PREFILLED_SYRINGE | Freq: Once | INTRAMUSCULAR | Status: AC
  Administered 2023-12-31: 50 ug via INTRAVENOUS

## 2023-12-31 MED ORDER — BUPIVACAINE-EPINEPHRINE (PF) 0.5% -1:200000 IJ SOLN
INTRAMUSCULAR | Status: AC
  Filled 2023-12-31: qty 10

## 2023-12-31 MED ORDER — EPHEDRINE SULFATE-NACL 50-0.9 MG/10ML-% IV SOSY
PREFILLED_SYRINGE | INTRAVENOUS | Status: DC | PRN
  Administered 2023-12-31: 5 mg via INTRAVENOUS
  Administered 2023-12-31: 10 mg via INTRAVENOUS
  Administered 2023-12-31: 5 mg via INTRAVENOUS
  Administered 2023-12-31: 7.5 mg via INTRAVENOUS

## 2023-12-31 MED ORDER — BUPIVACAINE LIPOSOME 1.3 % IJ SUSP
INTRAMUSCULAR | Status: DC | PRN
  Administered 2023-12-31: 20 mL via PERINEURAL

## 2023-12-31 MED ORDER — FENTANYL CITRATE (PF) 100 MCG/2ML IJ SOLN
INTRAMUSCULAR | Status: DC | PRN
  Administered 2023-12-31 (×2): 50 ug via INTRAVENOUS

## 2023-12-31 MED ORDER — DROPERIDOL 2.5 MG/ML IJ SOLN: 0.6250 mg | Freq: Once | INTRAMUSCULAR | Status: DC | PRN

## 2023-12-31 MED ORDER — ROCURONIUM BROMIDE 100 MG/10ML IV SOLN
INTRAVENOUS | Status: DC | PRN
  Administered 2023-12-31 (×2): 10 mg via INTRAVENOUS
  Administered 2023-12-31: 20 mg via INTRAVENOUS
  Administered 2023-12-31: 50 mg via INTRAVENOUS
  Administered 2023-12-31: 10 mg via INTRAVENOUS

## 2023-12-31 MED ORDER — ROCURONIUM BROMIDE 10 MG/ML (PF) SYRINGE
PREFILLED_SYRINGE | INTRAVENOUS | Status: AC
  Filled 2023-12-31: qty 10

## 2023-12-31 MED ORDER — CEFAZOLIN SODIUM 1 G IJ SOLR
INTRAMUSCULAR | Status: AC
  Filled 2023-12-31: qty 10

## 2023-12-31 MED ORDER — ONDANSETRON 4 MG PO TBDP: 4.0000 mg | ORAL_TABLET | Freq: Three times a day (TID) | ORAL | 0 refills | Status: AC | PRN

## 2023-12-31 MED ORDER — FENTANYL CITRATE PF 50 MCG/ML IJ SOSY
PREFILLED_SYRINGE | INTRAMUSCULAR | Status: AC
  Filled 2023-12-31: qty 1

## 2023-12-31 MED ORDER — LIDOCAINE HCL (PF) 1 % IJ SOLN
INTRAMUSCULAR | Status: AC
  Filled 2023-12-31: qty 30

## 2023-12-31 MED ORDER — ASPIRIN 325 MG PO TBEC: 325.0000 mg | DELAYED_RELEASE_TABLET | Freq: Every day | ORAL | 0 refills | Status: AC

## 2023-12-31 MED ORDER — PROPOFOL 10 MG/ML IV BOLUS
INTRAVENOUS | Status: AC
  Filled 2023-12-31: qty 20

## 2023-12-31 MED ORDER — CEFAZOLIN SODIUM-DEXTROSE 2-4 GM/100ML-% IV SOLN
INTRAVENOUS | Status: AC
  Filled 2023-12-31: qty 100

## 2023-12-31 MED ORDER — OXYCODONE HCL 5 MG PO TABS: 5.0000 mg | ORAL_TABLET | ORAL | 0 refills | Status: DC | PRN

## 2023-12-31 MED ORDER — FENTANYL CITRATE (PF) 100 MCG/2ML IJ SOLN: 25.0000 ug | INTRAMUSCULAR | Status: DC | PRN

## 2023-12-31 MED ORDER — MIDAZOLAM HCL 2 MG/2ML IJ SOLN
INTRAMUSCULAR | Status: AC
  Filled 2023-12-31: qty 2

## 2023-12-31 MED ORDER — BUPIVACAINE HCL (PF) 0.5 % IJ SOLN
INTRAMUSCULAR | Status: DC | PRN
  Administered 2023-12-31: 10 mL via PERINEURAL

## 2023-12-31 MED ORDER — MIDAZOLAM HCL 2 MG/2ML IJ SOLN
1.0000 mg | INTRAMUSCULAR | Status: DC | PRN
  Administered 2023-12-31: 1 mg via INTRAVENOUS

## 2023-12-31 MED ORDER — RINGERS IRRIGATION IR SOLN
Status: DC | PRN
  Administered 2023-12-31: 6000 mL
  Administered 2023-12-31 (×2): 3000 mL
  Administered 2023-12-31 (×3): 6000 mL
  Administered 2023-12-31: 3000 mL
  Administered 2023-12-31: 6000 mL

## 2023-12-31 MED ORDER — CHLORHEXIDINE GLUCONATE 0.12 % MT SOLN
OROMUCOSAL | Status: AC
  Filled 2023-12-31: qty 15

## 2023-12-31 SURGICAL SUPPLY — 86 items
ADAPTER IRRIG TUBE 2 SPIKE SOL (ADAPTER) ×4 IMPLANT
ANCHOR 2.3 SP SGL 1.2 XBRAID (Anchor) IMPLANT
ANCHOR SWIVELOCK BIO 4.75X19.1 (Anchor) IMPLANT
BLADE SHAVER 4.5X7 STR FR (MISCELLANEOUS) ×2 IMPLANT
BLADE SURG 15 STRL LF DISP TIS (BLADE) IMPLANT
BRUSH SCRUB EZ 4% CHG (MISCELLANEOUS) ×2 IMPLANT
BUR BR 5.5 WIDE MOUTH (BURR) IMPLANT
CANNULA PART THRD DISP 5.75X7 (CANNULA) IMPLANT
CANNULA PARTIAL THREAD 2X7 (CANNULA) ×2 IMPLANT
CANNULA TWIST IN 8.25X7CM (CANNULA) IMPLANT
CANNULA TWIST IN 8.25X9CM (CANNULA) IMPLANT
CHLORAPREP W/TINT 26 (MISCELLANEOUS) ×2 IMPLANT
COOLER POLAR GLACIER W/PUMP (MISCELLANEOUS) ×2 IMPLANT
DERMABOND ADVANCED .7 DNX12 (GAUZE/BANDAGES/DRESSINGS) IMPLANT
DEVICE SUCT BLK HOLE OR FLOOR (MISCELLANEOUS) ×4 IMPLANT
DRAPE INCISE IOBAN 66X45 STRL (DRAPES) ×2 IMPLANT
DRAPE SHEET LG 3/4 BI-LAMINATE (DRAPES) ×2 IMPLANT
DRAPE STERI 35X30 U-POUCH (DRAPES) ×2 IMPLANT
DRAPE U-SHAPE 47X51 STRL (DRAPES) ×4 IMPLANT
DRSG TEGADERM 4X4.75 (GAUZE/BANDAGES/DRESSINGS) ×4 IMPLANT
ELECT REM PT RETURN 9FT ADLT (ELECTROSURGICAL) ×1
ELECTRODE REM PT RTRN 9FT ADLT (ELECTROSURGICAL) IMPLANT
GAUZE SPONGE 4X4 12PLY STRL (GAUZE/BANDAGES/DRESSINGS) ×2 IMPLANT
GAUZE XEROFORM 1X8 LF (GAUZE/BANDAGES/DRESSINGS) ×2 IMPLANT
GLOVE BIO SURGEON STRL SZ7.5 (GLOVE) ×2 IMPLANT
GLOVE BIOGEL PI IND STRL 8 (GLOVE) ×4 IMPLANT
GLOVE SURG ORTHO 8.0 STRL STRW (GLOVE) ×2 IMPLANT
GLOVE SURG SYN 7.5 E (GLOVE) ×2
GLOVE SURG SYN 7.5 PF PI (GLOVE) ×2 IMPLANT
GOWN STRL REUS W/ TWL LRG LVL3 (GOWN DISPOSABLE) ×4 IMPLANT
GOWN STRL REUS W/ TWL XL LVL3 (GOWN DISPOSABLE) ×2 IMPLANT
GOWN STRL REUS W/TWL LRG LVL4 (GOWN DISPOSABLE) ×2 IMPLANT
GRAFT TISS 35X35 2 THK DERM (Tissue) IMPLANT
HANDLE YANKAUER SUCT BULB TIP (MISCELLANEOUS) IMPLANT
IMPL FIBERSTICH 2-0 CVD (Anchor) IMPLANT
IMPL FIBERSTITCH 1.5X24 CVD (Anchor) IMPLANT
IMPLANT FIBERSTICH 2-0 CVD (Anchor) ×2 IMPLANT
IMPLANT FIBERSTITCH 1.5X24 CVD (Anchor) ×2 IMPLANT
IV LR IRRIG 3000ML ARTHROMATIC (IV SOLUTION) ×8 IMPLANT
KIT STABILIZATION SHOULDER (MISCELLANEOUS) ×2 IMPLANT
KIT SUTURETAK 3.0 INSERT PERC (KITS) ×2 IMPLANT
KIT TURNOVER KIT A (KITS) ×2 IMPLANT
MANIFOLD NEPTUNE II (INSTRUMENTS) ×4 IMPLANT
MASK FACE SPIDER DISP (MASK) ×2 IMPLANT
MAT ABSORB FLUID 56X50 GRAY (MISCELLANEOUS) ×4 IMPLANT
NDL HYPO 22X1.5 SAFETY MO (MISCELLANEOUS) ×2 IMPLANT
NDL SAFETY ECLIPSE 18X1.5 (NEEDLE) ×2 IMPLANT
NEEDLE HYPO 22X1.5 SAFETY MO (MISCELLANEOUS) ×1
NS IRRIG 500ML POUR BTL (IV SOLUTION) ×2 IMPLANT
PACK ARTHROSCOPY SHOULDER (MISCELLANEOUS) ×2 IMPLANT
PAD ABD DERMACEA PRESS 5X9 (GAUZE/BANDAGES/DRESSINGS) ×2 IMPLANT
PAD ARMBOARD 7.5X6 YLW CONV (MISCELLANEOUS) ×2 IMPLANT
PAD WRAPON POLAR SHDR XLG (MISCELLANEOUS) ×2 IMPLANT
PASSER SUT FIRSTPASS SELF (INSTRUMENTS) ×2 IMPLANT
SLEEVE REMOTE CONTROL 5X12 (DRAPES) IMPLANT
SLING ULTRA II M (MISCELLANEOUS) ×2 IMPLANT
SPONGE T-LAP 18X18 ~~LOC~~+RFID (SPONGE) ×2 IMPLANT
STAPLER SKIN PROX 35W (STAPLE) IMPLANT
STRAP SAFETY 5IN WIDE (MISCELLANEOUS) ×2 IMPLANT
SUT ETHILON 3-0 FS-10 30 BLK (SUTURE) ×1
SUT FIBERWIRE #2 38 BLUE 1/2 (SUTURE) ×1
SUT LASSO 90 DEG SD STR (SUTURE) ×2 IMPLANT
SUT MNCRL 4-0 27XMFL (SUTURE) ×1
SUT PDS AB 0 CT1 27 (SUTURE) IMPLANT
SUT VIC AB 0 CT1 36 (SUTURE) ×2 IMPLANT
SUT VIC AB 2-0 CT2 27 (SUTURE) ×2 IMPLANT
SUT XBRAID 1.4 BLK/WHT (SUTURE) IMPLANT
SUTURE EHLN 3-0 FS-10 30 BLK (SUTURE) ×2 IMPLANT
SUTURE FIBERWR #2 38 BLUE 1/2 (SUTURE) IMPLANT
SUTURE MNCRL 4-0 27XMF (SUTURE) ×2 IMPLANT
SUTURE TAPE 1.3 40 TPR END (SUTURE) IMPLANT
SUTURE TAPE FIBERLINK 1.3 LOOP (SUTURE) IMPLANT
SUTURETAPE 1.3 40 TPR END (SUTURE) ×4
SUTURETAPE FIBERLINK 1.3 LOOP (SUTURE) ×1
SWIVELOCK SP BC KL 5.5 (Anchor) IMPLANT
SYR 10ML LL (SYRINGE) ×2 IMPLANT
TAPE CLOTH 3X10 WHT NS LF (GAUZE/BANDAGES/DRESSINGS) ×2 IMPLANT
TISSUE ARTHROFLEX DERMUS 35X35 (Tissue) ×1 IMPLANT
TRAP FLUID SMOKE EVACUATOR (MISCELLANEOUS) ×2 IMPLANT
TUBE SET DOUBLEFLO INFLOW (TUBING) ×2 IMPLANT
TUBE SET DOUBLEFLO OUTFLOW (TUBING) ×2 IMPLANT
TUBING CONNECTING 10 (TUBING) ×2 IMPLANT
WAND WEREWOLF FLOW 90D (MISCELLANEOUS) ×2 IMPLANT
WATER STERILE IRR 500ML POUR (IV SOLUTION) ×2 IMPLANT
WRAP SHOULDER HOT/COLD PACK (SOFTGOODS) ×2 IMPLANT
WRAPON POLAR PAD SHDR XLG (MISCELLANEOUS) ×1

## 2023-12-31 NOTE — Anesthesia Postprocedure Evaluation (Signed)
 Anesthesia Post Note  Patient: Marc Blair  Procedure(s) Performed: Left arthroscopic subscapularis and supraspinatus repair, calcific deposit excision, and distal clavicle excision (Left: Shoulder)  Patient location during evaluation: PACU Anesthesia Type: General Level of consciousness: awake and alert Pain management: pain level controlled Vital Signs Assessment: post-procedure vital signs reviewed and stable Respiratory status: spontaneous breathing, nonlabored ventilation, respiratory function stable and patient connected to nasal cannula oxygen Cardiovascular status: blood pressure returned to baseline and stable Postop Assessment: no apparent nausea or vomiting Anesthetic complications: no   No notable events documented.   Last Vitals:  Vitals:   12/31/23 1800 12/31/23 1811  BP: 132/66 127/63  Pulse: 68 70  Resp: 16 18  Temp: 36.7 C 36.7 C  SpO2: 96% 96%    Last Pain:  Vitals:   12/31/23 1811  TempSrc: Temporal  PainSc: 0-No pain                 Rome Ade

## 2023-12-31 NOTE — Transfer of Care (Signed)
 Immediate Anesthesia Transfer of Care Note  Patient: Marc Blair  Procedure(s) Performed: Left arthroscopic subscapularis and supraspinatus repair, calcific deposit excision, and distal clavicle excision (Left: Shoulder)  Patient Location: PACU  Anesthesia Type:General  Level of Consciousness: awake, drowsy, and patient cooperative  Airway & Oxygen Therapy: Patient Spontanous Breathing and Patient connected to face mask oxygen  Post-op Assessment: Report given to RN and Post -op Vital signs reviewed and stable  Post vital signs: Reviewed and stable  Last Vitals:  Vitals Value Taken Time  BP 122/68 12/31/23 1731  Temp    Pulse 66 12/31/23 1736  Resp 16 12/31/23 1736  SpO2 100 % 12/31/23 1736  Vitals shown include unfiled device data.  Last Pain:  Vitals:   12/31/23 1051  TempSrc: Temporal  PainSc: 0-No pain         Complications: No notable events documented.

## 2023-12-31 NOTE — Op Note (Signed)
 SURGERY DATE: 12/31/2023    PRE-OP DIAGNOSIS:  1. Left subacromial impingement 2. Left proximal biceps rupture 3. Left rotator cuff tear 4. Left acromioclavicular joint arthritis   POST-OP DIAGNOSIS: 1. Left subacromial impingement 2. Left biceps medial dislocation 3. Left rotator cuff tear 4. Left acromioclavicular joint arthritis   PROCEDURES:  1. Left arthroscopic rotator cuff repair (subscapularis) 2. Left mini-open rotator cuff repair with allograft augmentation (supraspinatus & infraspinatus) 3. Left arthroscopic biceps tenodesis 4. Left arthroscopic subacromial decompression 5. Left arthroscopic extensive debridement of shoulder (glenohumeral and subacromial spaces) 6. Left arthroscopic distal clavicle excision' 7. Left infraspinatus calcific tendinitis excision   SURGEON: Earnestine HILARIO Blanch, MD   ASSISTANT: DOROTHA Krystal Doyne, PA   ANESTHESIA: Gen with Exparel  interscalene block   ESTIMATED BLOOD LOSS: 25cc   DRAINS:  none   TOTAL IV FLUIDS: per anesthesia      SPECIMENS: none   IMPLANTS:  - Arthrex 4.59mm SwiveLock x 3 - Arthrex 5.66mm SwiveLock x 1 - Iconix SPEED double loaded with 1.2 and 2.0mm tape x 2     OPERATIVE FINDINGS:  Examination under anesthesia: A careful examination under anesthesia was performed.  Passive range of motion was: FF: 150; ER at side: 45; ER in abduction: 90; IR in abduction: 45.  Anterior load shift: NT.  Posterior load shift: NT.  Sulcus in neutral: NT.  Sulcus in ER: NT.     Intra-operative findings: A thorough arthroscopic examination of the shoulder was performed.  The findings are: 1. Biceps tendon: Dislocated medially 2. Superior labrum: degenerative with surrounding synovitis 3. Posterior labrum and capsule: normal 4. Inferior capsule and inferior recess: normal 5. Glenoid cartilage surface: Normal 6. Supraspinatus attachment: full-thickness tear at the articular margin/musculotendinous junction of the infraspinatus and  supraspinatus with significant degenerative rotator cuff. There was also a medial/lateral split of the supraspinatus.  7. Posterior rotator cuff attachment:  as above 8. Humeral head articular cartilage: Normal 9. Rotator interval: Severe synovitis 10: Subscapularis tendon: full-thickness tear, retracted to level of glenoid 11. Anterior labrum: degenerative 12. IGHL: significant synovitis around IGHL   OPERATIVE REPORT:    Indications for procedure:  Marc Blair is a 68 y.o. with ~3 years of shoulder pain that significantly worsened ~6 months ago. He had failed extensive nonoperative management. Clinical exam and MRI were suggestive of rotator cuff tears, medial biceps dislocation, subacromial impingement, and AC joint arthritis. Given these findings, we decided to proceed with surgical management. After discussion of risks, benefits, and alternatives to surgery, the patient elected to proceed.    Procedure in detail:   I identified the patient in the pre-operative holding area.  I marked the operative shoulder with my initials. I reviewed the risks and benefits of the proposed surgical intervention, and the patient wished to proceed. Anesthesia was then performed with an Exparel  interscalene block.  The patient was transferred to the operative suite and placed in the beach chair position.     SCDs were placed on the lower extremities. Appropriate IV antibiotics were administered prior to incision. The operative upper extremity was then prepped and draped in standard fashion. A time out was performed confirming the correct extremity, correct patient, and correct procedure.    I then created a standard posterior portal with an 11 blade. The glenohumeral joint was easily entered with a blunt trochar and the arthroscope introduced. The findings of diagnostic arthroscopy are described above.  A standard anterior portal was made.  I debrided degenerative tissue including the  synovitic tissue about  the rotator interval and IGHL as well as the anterior and superior labrum.  I then coagulated the inflamed synovium in these regions to obtain hemostasis and reduce the risk of post-operative swelling using an Arthrocare radiofrequency device.    I then turned my attention to the arthroscopic biceps tenodesis. The Loop n Tack technique was used to pass a FiberTape through the biceps in a locked fashion adjacent to the biceps anchor. The biceps tendon was then cut and the biceps anchor complex was debrided down to a stable base on the superior labrum. The stitch was then pulled out of the anterior portal for later tenodesis.   The arthroscopic repair of the subscapularis was performed next.  A superior anterolateral portal was made under needle localization.  A cannula was placed.  The comma tissue indicating the superolateral border of the subscapularis was identified. The comma tissue was tagged with a FiberWire suture and tension on this allowed for appropriate mobilization of the subscapularis. The tip of the coracoid as well as the conjoined tendon and coracoacromial ligaments were visualized after debriding rotator interval tissue. Tissue about the subscapularis was released anteriorly, superiorly, and posteriorly to allow for improved mobilization. The lesser tuberosity footprint was prepared with a combination of electrocautery and burr. 2 suture tapes were passed in a mattress fashion. The arm was placed in a neutral position. The inferior strand of each suture tape was loaded onto a 5.68mm Arthrex SwiveLock and impacted into the inferior lesser tuberosity just medial to bicipital groove. The superior two strands of suture, both strands of FiberTape in the comma tissue, and the biceps stitch were then loaded onto a 4.75 mm SwiveLock anchor. This was placed at the superior portion of the bicipital groove. This construct appropriately reduced the subscapularis tear and secured the proximal biceps while  taking it off of tension.  The arm was then internally and externally rotated and the subscapularis was noted to move appropriately with rotation.  The remainder of the suture was then cut.   Next, the arthroscope was then introduced into the subacromial space. A direct lateral portal was created with an 11-blade after spinal needle localization. An extensive subacromial bursectomy and debridement was performed using a combination of the shaver and Arthrocare wand. The entire acromial undersurface was exposed and the CA ligament was subperiosteally elevated to expose the anterior acromial hook. A burr was used to create a flat anterior and lateral aspect of the acromion, converting it from a Type 2 to a Type 1 acromion. Care was made to keep the deltoid fascia intact.   I then turned my attention to the arthroscopic distal clavicle excision. I identified the acromioclavicular joint. Surrounding bursal tissue was debrided and the edges of the joint were identified. I used the 5.50mm barrel burr to remove the distal clavicle parallel to the edge of the acromion. I was able to fit two widths of the burr into the space between the distal clavicle and acromion, signifying that I had removed ~46mm of distal clavicle. This was confirmed by viewing anteriorly and introducing a probe with measuring marks from the lateral portal. Hemostasis was achieved with an Arthrocare wand.    Given the complexity of the superior rotator cuff tear pattern, decision was made to perform this in a mini-open fashion.  A longitudinal incision from the anterolateral acromion ~6cm in length was made overlying the raphe between the anterior and middle heads of the deltoid. This incorporated the anterolateral portal. The  raphe was identified and it was incised. The subacromial space was identified. Any remaining bursa was excised.    The arm was then internally rotated.  The rotator cuff footprint was cleared of soft tissue where it was  significantly degenerative anteriorly. Two Iconix SPEED anchors were placed just lateral to the articular margin, one anteriorly and 1 posteriorly. Free suturetae was passed in a mattress fashion in the lateral stump of the infraspinatus. Two side-to-side stitches were then placed from the medial to lateral along the anterior infraspinatus and posterior supraspinatus. Posterior anchor sutures were passed through the medial stump along the posterior aspect of the tissue. Anterior anchor sutures were passed in a side-to-side fashion from the anterior supraspinatus tissue to the posterior supraspinatus tissue. These were then tied. The posterior cuff side-to-side sutures were also then tied. An additional two side-to-side sutures were placed to bring the anterior aspect of the medial stump to the anterior lateral supraspinatus tissue covering the footprint. Lastly, two free suturetapes were placed in a mattress fashion of the anterior medial supraspinatus tissue. These four strands were then passed through an Arthrex 4.75 mm SwiveLock anchor.  These were fixed on the posterior aspect of the greater tuberosity while placing the sutures under appropriate tension.  This served as lateral row fixation.  Similarly the 6 strands of posterior sutures were passed through another SwiveLock anchor and impacted anteriorly.  This construct allowed for appropriate re-approximation of the superior rotator cuff, but it was thinned in some areas. Therefore, decision was made to augment repair with a 2mm Arthroflex dermal allograft. Medial fixation was achieved using Fiberstitch implants x 2 into the medial cuff. Lateral fixation was achieved using the knotless mechanism of the lateral row SwiveLocks. This construct was stable with external and internal rotation.   The wound was thoroughly irrigated.  The deltoid split was closed with 0 Vicryl.  The subdermal layer was closed with 2-0 Vicryl.  The skin was closed with staples. The  portals were closed with 3-0 Nylon. Xeroform was applied to the incisions. A sterile dressing was applied, followed by a Polar Care sleeve and a SlingShot shoulder immobilizer/sling. The patient was awakened from anesthesia without difficulty and was transferred to the PACU in stable condition.    COMPLICATIONS: none   DISPOSITION: plan for discharge home after recovery in PACU    POSTOPERATIVE PLAN: Remain in sling (except hygiene and elbow/wrist/hand RoM exercises as instructed by PT) x 6 weeks and NWB for this time. PT to begin 3-4 days after surgery.  Use massive rotator cuff repair/allograft/superior capsular reconstruction rehab protocol with subscapularis restrictions.  ASA 325mg  daily x 2 weeks for DVT ppx.

## 2023-12-31 NOTE — Anesthesia Procedure Notes (Signed)
 Anesthesia Regional Block: Interscalene brachial plexus block   Pre-Anesthetic Checklist: , timeout performed,  Correct Patient, Correct Site, Correct Laterality,  Correct Procedure, Correct Position, site marked,  Risks and benefits discussed,  Surgical consent,  Pre-op evaluation,  At surgeon's request and post-op pain management  Laterality: Left and Upper  Prep: chloraprep       Needles:  Injection technique: Single-shot  Needle Type: Stimiplex     Needle Length: 5cm  Needle Gauge: 22     Additional Needles:   Procedures:,,,, ultrasound used (permanent image in chart),,    Narrative:  Start time: 12/31/2023 12:30 PM End time: 12/31/2023 12:32 PM Injection made incrementally with aspirations every 5 mL.  Performed by: Personally  Anesthesiologist: Dario Barter, MD  Additional Notes: Functioning IV was confirmed and monitors were applied.  A 50mm 22ga Stimuplex needle was used. Sterile prep and drape,hand hygiene and sterile gloves were used.  Negative aspiration and negative test dose prior to incremental administration of local anesthetic. The patient tolerated the procedure well.

## 2023-12-31 NOTE — Anesthesia Procedure Notes (Signed)
 Procedure Name: Intubation Date/Time: 12/31/2023 12:51 PM  Performed by: Sophya Vanblarcom, CRNAPre-anesthesia Checklist: Patient identified, Patient being monitored, Timeout performed, Emergency Drugs available and Suction available Patient Re-evaluated:Patient Re-evaluated prior to induction Oxygen Delivery Method: Circle system utilized Preoxygenation: Pre-oxygenation with 100% oxygen Induction Type: IV induction Ventilation: Mask ventilation without difficulty and Oral airway inserted - appropriate to patient size Laryngoscope Size: McGrath and 4 Grade View: Grade I Tube type: Oral Tube size: 7.5 mm Number of attempts: 1 Airway Equipment and Method: Stylet Placement Confirmation: ETT inserted through vocal cords under direct vision, positive ETCO2 and breath sounds checked- equal and bilateral Secured at: 23 cm Tube secured with: Tape Dental Injury: Teeth and Oropharynx as per pre-operative assessment

## 2023-12-31 NOTE — Discharge Instructions (Addendum)
 Post-Op Instructions - Rotator Cuff Repair  1. Bracing: You will wear a shoulder immobilizer or sling for 6 weeks.   2. Driving: No driving for 3 weeks post-op. When driving, do not wear the immobilizer. Ideally, we recommend no driving for 6 weeks while sling is in place as one arm will be immobilized.   3. Activity: No active lifting for 2 months. Wrist, hand, and elbow motion only. Avoid lifting the upper arm away from the body except for hygiene. You are permitted to bend and straighten the elbow passively only (no active elbow motion). You may use your hand and wrist for typing, writing, and managing utensils (cutting food). Do not lift more than a coffee cup for 8 weeks.  When sleeping or resting, inclined positions (recliner chair or wedge pillow) and a pillow under the forearm for support may provide better comfort for up to 4 weeks.  Avoid long distance travel for 4 weeks.  Return to normal activities after rotator cuff repair repair normally takes 6 months on average. If rehab goes very well, may be able to do most activities at 4 months, except overhead or contact sports.  4. Physical Therapy: Begins 3-4 days after surgery, and proceed 1 time per week for the first 6 weeks, then 1-2 times per week from weeks 6-20 post-op.  5. Medications:  - You will be provided a prescription for narcotic pain medicine. After surgery, take 1-2 narcotic tablets every 4 hours if needed for severe pain.  - A prescription for anti-nausea medication will be provided in case the narcotic medicine causes nausea - take 1 tablet every 6 hours only if nauseated.   - Take tylenol  1000 mg (2 Extra Strength tablets or 3 regular strength) every 8 hours for pain.  May decrease or stop tylenol  5 days after surgery if you are having minimal pain. - Take ASA 325mg /day x 2 weeks to help prevent DVTs/PEs (blood clots).  - DO NOT take ANY nonsteroidal anti-inflammatory pain medications (Advil, Motrin, Ibuprofen, Aleve,  Naproxen, or Naprosyn). These medicines can inhibit healing of your shoulder repair.    If you are taking prescription medication for anxiety, depression, insomnia, muscle spasm, chronic pain, or for attention deficit disorder, you are advised that you are at a higher risk of adverse effects with use of narcotics post-op, including narcotic addiction/dependence, depressed breathing, death. If you use non-prescribed substances: alcohol, marijuana, cocaine, heroin, methamphetamines, etc., you are at a higher risk of adverse effects with use of narcotics post-op, including narcotic addiction/dependence, depressed breathing, death. You are advised that taking > 50 morphine  milligram equivalents (MME) of narcotic pain medication per day results in twice the risk of overdose or death. For your prescription provided: oxycodone  5 mg - taking more than 6 tablets per day would result in > 50 morphine  milligram equivalents (MME) of narcotic pain medication. Be advised that we will prescribe narcotics short-term, for acute post-operative pain only - 3 weeks for major operations such as shoulder repair/reconstruction surgeries.     6. Post-Op Appointment:  Your first post-op appointment will be 10-14 days post-op.  7. Work or School: For most, but not all procedures, we advise staying out of work or school for at least 1 to 2 weeks in order to recover from the stress of surgery and to allow time for healing.   If you need a work or school note this can be provided.   8. Smoking: If you are a smoker, you need to refrain from  smoking in the postoperative period. The nicotine in cigarettes will inhibit healing of your shoulder repair and decrease the chance of successful repair. Similarly, nicotine containing products (gum, patches) should be avoided.   Post-operative Brace: Apply and remove the brace you received as you were instructed to at the time of fitting and as described in detail as the brace's  instructions for use indicate.  Wear the brace for the period of time prescribed by your physician.  The brace can be cleaned with soap and water  and allowed to air dry only.  Should the brace result in increased pain, decreased feeling (numbness/tingling), increased swelling or an overall worsening of your medical condition, please contact your doctor immediately.  If an emergency situation occurs as a result of wearing the brace after normal business hours, please dial 911 and seek immediate medical attention.  Let your doctor know if you have any further questions about the brace issued to you. Refer to the shoulder sling instructions for use if you have any questions regarding the correct fit of your shoulder sling.  [facility] Customer Care for Troubleshooting: [phone]  Video that illustrates how to properly use a shoulder sling: Instructions for Proper Use of an Orthopaedic Sling Http://bass.com/   Interscalene Nerve Block (ISNB) Discharge Instructions    For your surgery you have received an Interscalene Nerve Block. Nerve Blocks affect many types of nerves, including nerves that control movement, pain and normal sensation.  You may experience feelings such as numbness, tingling, heaviness, weakness or the inability to move your arm or the feeling or sensation that your arm has fallen asleep. A nerve block can last for 2 - 36 hours or more depending on the medication used.  Usually the weakness wears off first.  The tingling and heaviness usually wear off next.  Finally you may start to notice pain.  Keep in mind that this may occur in any order.  once a nerve block starts to wear off it is usually completely gone within 60 minutes. ISNB may cause mild shortness of breath, a hoarse voice, blurry vision, unequal pupils, or drooping of the face on the same side as the nerve block.  These symptoms will usually go away within 12 hours.  Very rarely the procedure itself  can cause mild seizures. If needed, your surgeon will give you a prescription for pain medication.  It will take about 60 minutes for the oral pain medication to become fully effective.  So, it is recommended that you start taking this medication before the nerve block first begins to wear off, or when you first begin to feel discomfort. Keep in mind that nerve blocks often wear off in the middle of the night.   If you are going to bed and the block has not started to wear off or you have not started to have any discomfort, consider setting an alarm for 2 to 3 hours, so you can assess your block.  If you notice the block is wearing off or you are starting to have discomfort, you can take your pain medication. Take your pain medication only as prescribed.  Pain medication can cause sedation and decrease your breathing if you take more than you need for the level of pain that you have. Nausea is a common side effect of many pain medications.  You may want to eat something before taking your pain medicine to prevent nausea. After an Interscalene nerve block, you cannot feel pain, pressure or extremes in temperature in  the effected arm.  Because your arm is numb it is at an increased risk for injury.  To decrease the possibility of injury, please practice the following:  While you are awake change the position of your arm frequently to prevent too much pressure on any one area for prolonged periods of time.  If you have a cast or tight dressing, check the color or your fingers every couple of hours.  Call your surgeon with the appearance of any discoloration (white or blue). If you are given a sling to wear before you go home, please wear it  at all times until the block has completely worn off.  Do not get up at night without your sling. If you experience any problems or concerns, please contact your surgeon's office. If you experience severe or prolonged shortness of breath go to the nearest emergency  department.

## 2023-12-31 NOTE — Anesthesia Preprocedure Evaluation (Signed)
 Anesthesia Evaluation  Patient identified by MRN, date of birth, ID band Patient awake    Reviewed: Allergy & Precautions, H&P , NPO status , Patient's Chart, lab work & pertinent test results, reviewed documented beta blocker date and time   History of Anesthesia Complications Negative for: history of anesthetic complications  Airway Mallampati: III  TM Distance: >3 FB Neck ROM: full    Dental  (+) Dental Advidsory Given, Partial Upper, Partial Lower   Pulmonary neg shortness of breath, sleep apnea and Continuous Positive Airway Pressure Ventilation , neg COPD, neg recent URI, former smoker   Pulmonary exam normal breath sounds clear to auscultation       Cardiovascular Exercise Tolerance: Good hypertension, + angina (stable)  + CAD, + Past MI, + Cardiac Stents and +CHF  Normal cardiovascular exam(-) dysrhythmias (-) Valvular Problems/Murmurs Rhythm:regular Rate:Normal     Neuro/Psych negative neurological ROS  negative psych ROS   GI/Hepatic Neg liver ROS, PUD,GERD  ,,  Endo/Other  diabetes    Renal/GU negative Renal ROS  negative genitourinary   Musculoskeletal   Abdominal   Peds  Hematology negative hematology ROS (+)   Anesthesia Other Findings Past Medical History: No date: CHF (congestive heart failure) (HCC) No date: Coronary artery disease No date: Diabetes mellitus without complication (HCC) No date: GERD (gastroesophageal reflux disease) No date: H/O angioplasty No date: High cholesterol No date: Hypertension 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, 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 No date: Obesity No date: Sleep apnea Dec 2016: Stented coronary artery   Reproductive/Obstetrics negative OB ROS                             Anesthesia Physical Anesthesia Plan  ASA: 3  Anesthesia Plan: General   Post-op Pain Management: Regional block*   Induction: Intravenous  PONV Risk Score and Plan: 2 and  Ondansetron , Dexamethasone  and Treatment may vary due to age or medical condition  Airway Management Planned: Oral ETT  Additional Equipment:   Intra-op Plan:   Post-operative Plan: Extubation in OR  Informed Consent: I have reviewed the patients History and Physical, chart, labs and discussed the procedure including the risks, benefits and alternatives for the proposed anesthesia with the patient or authorized representative who has indicated his/her understanding and acceptance.     Dental Advisory Given  Plan Discussed with: Anesthesiologist, CRNA and Surgeon  Anesthesia Plan Comments:         Anesthesia Quick Evaluation

## 2023-12-31 NOTE — H&P (Signed)
 Paper H&P to be scanned into permanent record. H&P reviewed. No significant changes noted.

## 2024-01-03 ENCOUNTER — Encounter: Payer: Self-pay | Admitting: Orthopedic Surgery

## 2024-04-05 ENCOUNTER — Encounter: Payer: Self-pay | Admitting: Dietician

## 2024-04-05 ENCOUNTER — Encounter: Attending: Nurse Practitioner | Admitting: Dietician

## 2024-04-05 DIAGNOSIS — E11649 Type 2 diabetes mellitus with hypoglycemia without coma: Secondary | ICD-10-CM | POA: Diagnosis present

## 2024-04-05 DIAGNOSIS — E782 Mixed hyperlipidemia: Secondary | ICD-10-CM | POA: Diagnosis present

## 2024-04-05 DIAGNOSIS — I10 Essential (primary) hypertension: Secondary | ICD-10-CM | POA: Diagnosis present

## 2024-04-05 DIAGNOSIS — E1140 Type 2 diabetes mellitus with diabetic neuropathy, unspecified: Secondary | ICD-10-CM

## 2024-04-05 DIAGNOSIS — Z713 Dietary counseling and surveillance: Secondary | ICD-10-CM | POA: Diagnosis not present

## 2024-04-05 NOTE — Patient Instructions (Addendum)
 Work towards a goal of 64 oz. of plain water daily.   Work on reducing your fat intake! Look up the nutrition facts for any of the restaurants/fast food establishments you visit and make it your mission to choose lower fat, lower calorie food options.  Desired Ranges on GCM Report: Low (below 70 mg/dL): <8% Time in Range (70 - 180 mg/dL): >11% High (914 - 782 mg/dL): <95%

## 2024-04-05 NOTE — Progress Notes (Signed)
 Diabetes Self-Management Education  Visit Type: First/Initial  Appt. Start Time: 1115 Appt. End Time: 1230  04/05/2024  [name], identified by name and date of birth, is a 68 y.o. male with a diagnosis of Diabetes: Type 2.   ASSESSMENT Pt reports a goal of weight loss to 220 lbs. (Reports weight of 240 lbs. At home this morning) and glycemic control. Pt reports taking 55u of Lantus daily in the morning, Ozempic @2 .0 mg weekly, metformin @2000  mg daily. No hypoglycemia or GI side effects. Pt reports losing 35 lbs in a year since starting Ozempic, states weight loss has reached a plateau in the last several months. 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 Pt states they aren't checking feet daily. Pt reports getting shoulder replacement in January, currently doing PT 2x a week, Very limited physical activity. Pt reports eating away from home regularly ([employer]), only making meals at home a few times a week, usually frozen chicken patties, hot dogs, hamburgers, recently started to eat single serving salads.Pt reports they eat too quickly, has history of trying to eat very quickly and will over consume before they  get full.  Pt reports using FreeStyle Libre3  CGM Results from download:  FreeStyle Libre3  % Time CGM active:   98%   (Goal >70%)  30 day  Average glucose:   138 mg/dL for 90 days  Time in range (70-180 mg/dL):   16%   (Goal >10%)  Time High (181-250 mg/dL):   96%   (Goal < 04%)  Time Low (54-69 mg/dL):   4%   (Goal <5%)     Diabetes Self-Management Education - 04/05/24 1132       Visit Information   Visit Type First/Initial      Initial Visit   Diabetes Type Type 2    Date Diagnosed 30+ years ago    Are you currently following a meal plan? No    Are you taking your medications as prescribed? Yes      Health Coping   How would you rate your overall health? Good      Psychosocial Assessment    Patient Belief/Attitude about Diabetes Motivated to manage diabetes    What is the hardest part about your diabetes right now, causing you the most concern, or is the most worrisome to you about your diabetes?   Making healty food and beverage choices    Self-care barriers None    Self-management support None    Other persons present Patient    Patient Concerns Nutrition/Meal planning;Healthy Lifestyle;Glycemic Control    Special Needs None    Preferred Learning Style Auditory;Hands on    Learning Readiness Not Ready    How often do you need to have someone help you when you read instructions, pamphlets, or other written materials from your doctor or pharmacy? 1 - Never    What is the last grade level you completed in school? 12th grade      Pre-Education Assessment   Patient understands the diabetes disease and treatment process. Needs Instruction    Patient understands incorporating nutritional management into lifestyle. Needs Instruction    Patient undertands incorporating physical activity into lifestyle. Needs Instruction    Patient understands using medications safely. Needs Instruction    Patient understands monitoring blood glucose, interpreting and using results Needs Instruction    Patient understands prevention, detection, and treatment of acute complications. Needs  Instruction    Patient understands prevention, detection, and treatment of chronic complications. Needs Instruction    Patient understands how to develop strategies to address psychosocial issues. Needs Instruction    Patient understands how to develop strategies to promote health/change behavior. Needs Instruction      Complications   Last HgB A1C per patient/outside source 6.6 %   11/05/2023   How often do you check your blood sugar? > 4 times/day    Fasting Blood glucose range (mg/dL) 16-109    Postprandial Blood glucose range (mg/dL) 604-540;981-191    Have you had a dilated eye exam in the past 12 months? Yes     Have you had a dental exam in the past 12 months? Yes    Are you checking your feet? Yes    How many days per week are you checking your feet? 3      Dietary Intake   Breakfast Coffee, [employer] breakfast burrito (spicy chicken, cheese, hashbrown), medium sweet tea    Lunch 1 [place] and cheese sandwiches, Fritos    Snack (evening) 2 cuties, 4 cinnamon graham crackers, 2% milk    Beverage(s) Coffee, Sweet tea, Diet sodas,      Activity / Exercise   Activity / Exercise Type ADL's;Light (walking / raking leaves)   Lawn work/vehicle repairs   How many days per week do you exercise? 2    How many minutes per day do you exercise? 90    Total minutes per week of exercise 180      Patient Education   Previous Diabetes Education Yes (please comment)   Early 2000's   Disease Pathophysiology Explored patient's options for treatment of their diabetes    Healthy Eating Meal options for control of blood glucose level and chronic complications.;Information on hints to eating out and maintain blood glucose control.    Being Active Helped patient identify appropriate exercises in relation to his/her diabetes, diabetes complications and other health issue.   PT for shoulder   Medications Reviewed patients medication for diabetes, action, purpose, timing of dose and side effects.    Monitoring Taught/evaluated CGM (comment)    Chronic complications Relationship between chronic complications and blood glucose control;Assessed and discussed foot care and prevention of foot problems;Identified and discussed with patient  current chronic complications    Diabetes Stress and Support Role of stress on diabetes;Identified and addressed patients feelings and concerns about diabetes   Stress from brother living with him   Lifestyle and Health Coping Lifestyle issues that need to be addressed for better diabetes care   Food choices away from home     Individualized Goals (developed by patient)   Nutrition  Follow meal plan discussed;General guidelines for healthy choices and portions discussed    Medications take my medication as prescribed    Monitoring  Consistenly use CGM    Problem Solving Eating Pattern    Reducing Risk do foot checks daily      Post-Education Assessment   Patient understands the diabetes disease and treatment process. Comprehends key points    Patient understands incorporating nutritional management into lifestyle. Needs Review    Patient undertands incorporating physical activity into lifestyle. Needs Review    Patient understands using medications safely. Demonstrates understanding / competency    Patient understands monitoring blood glucose, interpreting and using results Comprehends key points    Patient understands prevention, detection, and treatment of acute complications. Comprehends key points    Patient understands prevention, detection, and treatment  of chronic complications. Comprehends key points    Patient understands how to develop strategies to address psychosocial issues. Comprehends key points    Patient understands how to develop strategies to promote health/change behavior. Needs Review      Outcomes   Future DMSE 2 months    Program Status Not Completed             Individualized Plan for Diabetes Self-Management Training:   Learning Objective:  Patient will have a greater understanding of diabetes self-management. Patient education plan is to attend individual and/or group sessions per assessed needs and concerns.   Plan:   Patient Instructions  Work towards a goal of 64 oz. of plain water daily.   Work on reducing your fat intake! Look up the nutrition facts for any of the restaurants/fast food establishments you visit and make it your mission to choose lower fat, lower calorie food options.  Desired Ranges on GCM Report: Low (below 70 mg/dL): <8% Time in Range (70 - 180 mg/dL): >29% High (562 - 130 mg/dL):  <86%         Expected Outcomes:     Education material provided: ADA - How to Thrive: A Guide for Your Journey with Diabetes  If problems or questions, patient to contact team via:  Phone and Email  Future DSME appointment: 2 months

## 2024-05-03 ENCOUNTER — Other Ambulatory Visit
Admission: RE | Admit: 2024-05-03 | Discharge: 2024-05-03 | Disposition: A | Discharge status: Home / Self Care | Source: Ambulatory Visit | Attending: Sports Medicine | Admitting: Sports Medicine

## 2024-05-03 DIAGNOSIS — M7552 Bursitis of left shoulder: Secondary | ICD-10-CM | POA: Diagnosis present

## 2024-05-03 LAB — SYNOVIAL CELL COUNT + DIFF, W/ CRYSTALS
Crystals, Fluid: NONE SEEN
Eosinophils-Synovial: 0 %
Lymphocytes-Synovial Fld: 14 %
Monocyte-Macrophage-Synovial Fluid: 6 %
Neutrophil, Synovial: 80 %
WBC, Synovial: 26056 /mm3 — ABNORMAL HIGH (ref 0–200)

## 2024-05-19 ENCOUNTER — Encounter: Attending: Nurse Practitioner | Admitting: Dietician

## 2024-05-19 ENCOUNTER — Encounter: Payer: Self-pay | Admitting: Dietician

## 2024-05-19 VITALS — Ht 72.0 in | Wt 242.9 lb

## 2024-05-19 DIAGNOSIS — E1140 Type 2 diabetes mellitus with diabetic neuropathy, unspecified: Secondary | ICD-10-CM | POA: Diagnosis present

## 2024-05-19 NOTE — Progress Notes (Signed)
 Diabetes Self-Management Education  Visit Type: Follow-up  Appt. Start Time: 1115 Appt. End Time: 1200  05/19/2024  [name], identified by name and date of birth, is a 68 y.o. male with a diagnosis of Diabetes:  .   ASSESSMENT  Height 6' (1.829 m), weight 242 lb 14.4 oz (110.2 kg). Body mass index is 32.94 kg/m.  Pt reports continuing DM medication regiment, no changes, no side effects. Pt reports continuing to use FreeStyle Libre3, did not bring receiver to visit.  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 Pt reports taking 800 mg ibuprofen and 50 mg tramadol for pain, reports good relief. 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 Pt reports minimal dietary changes, trying to choose deli meats (ham, [place], chicken), deluxe american cheese, eating fruits a few times a week, limited vegetable intake.  Pt reports doing yardwork occasionally for activity, no other activity.   Diabetes Self-Management Education - 05/19/24 1152       Visit Information   Visit Type Follow-up      Complications   Last HgB A1C per patient/outside source 7.4 %   05/12/24   How often do you check your blood sugar? > 4 times/day   CGM     Dietary Intake   Breakfast Chick fil a breakfast burrito    Lunch 2 bowls of chicken noodle soup    Snack (afternoon) apple, banana    Snack (evening) cheddar cheese snacks, glass of 2% milk    Beverage(s) Milk, Sweet tea, Diet gingerale, water      Activity / Exercise   Activity / Exercise Type ADL's      Patient Education   Disease Pathophysiology Explored patient's options for treatment of their diabetes    Healthy Eating Role of diet in the treatment of diabetes and the relationship between the three main macronutrients and blood glucose level;Information on hints to eating out and maintain blood glucose control.    Being Active Helped patient identify  appropriate exercises in relation to his/her diabetes, diabetes complications and other health issue.    Monitoring Taught/evaluated CGM (comment);Identified appropriate SMBG and/or A1C goals.    Chronic complications Assessed and discussed foot care and prevention of foot problems;Relationship between chronic complications and blood glucose control      Individualized Goals (developed by patient)   Nutrition Follow meal plan discussed    Physical Activity Exercise 1-2 times per week    Medications take my medication as prescribed    Monitoring  Consistenly use CGM;Send in my blood glucose log as discussed    Problem Solving Eating Pattern      Patient Self-Evaluation of Goals - Patient rates self as meeting previously set goals (% of time)   Nutrition 25 - 50% (sometimes)    Physical Activity < 25% (hardly ever/never)    Medications >75% (most of the time)    Monitoring >75% (most of the time)   CGM   Problem Solving and behavior change strategies  < 25% (hardly ever/never)    Reducing Risk (treating acute and chronic complications) 25 - 50% (sometimes)    Health Coping 25 - 50% (sometimes)      Post-Education Assessment   Patient understands the diabetes disease and treatment process. Needs Review    Patient understands incorporating nutritional management into lifestyle. Needs Review    Patient undertands incorporating physical activity into lifestyle. Needs Review  Patient understands using medications safely. Demonstrates understanding / competency    Patient understands monitoring blood glucose, interpreting and using results Needs Review    Patient understands prevention, detection, and treatment of acute complications. Needs Review    Patient understands prevention, detection, and treatment of chronic complications. Needs Review    Patient understands how to develop strategies to address psychosocial issues. Needs Review    Patient understands how to develop strategies to  promote health/change behavior. Needs Review      Outcomes   Expected Outcomes Demonstrated interest in learning but significant barriers to change    Future DMSE PRN    Program Status Not Completed      Subsequent Visit   Since your last visit have you continued or begun to take your medications as prescribed? Yes    Since your last visit have you had your blood pressure checked? No    Since your last visit have you experienced any weight changes? Gain    Weight Gain (lbs) 8    Since your last visit, are you checking your blood glucose at least once a day? Yes             Individualized Plan for Diabetes Self-Management Training:   Learning Objective:  Patient will have a greater understanding of diabetes self-management. Patient education plan is to attend individual and/or group sessions per assessed needs and concerns.   Plan:   Patient Instructions  [facility]. This is the best, low impact form of exercise to improve your A1c while being easy on your body.  Continue to choose low fat meats and dairy, and low sodium canned goods!  Make it a point to add some vegetables to your meals as often as possible, steam some zucchini and squash!  Desired Ranges on GCM Report: Very Low (below 54 mg/dL): <1% Low (below 70 mg/dL): <6% Time in Range (70 - 180 mg/dL): >10% High (960 - 454 mg/dL): <09% Very High (above 250 mg/dL): <8%   Expected Outcomes:  Demonstrated interest in learning but significant barriers to change  If problems or questions, patient to contact team via:  Phone and Email  Future DSME appointment: PRN

## 2024-05-19 NOTE — Patient Instructions (Addendum)
 [facility]. This is the best, low impact form of exercise to improve your A1c while being easy on your body.  Continue to choose low fat meats and dairy, and low sodium canned goods!  Make it a point to add some vegetables to your meals as often as possible, steam some zucchini and squash!  Desired Ranges on GCM Report: Very Low (below 54 mg/dL): <3% Low (below 70 mg/dL): <0% Time in Range (70 - 180 mg/dL): >86% High (578 - 469 mg/dL): <62% Very High (above 250 mg/dL): <9%

## 2024-06-28 ENCOUNTER — Other Ambulatory Visit: Payer: Self-pay | Admitting: Orthopedic Surgery

## 2024-06-28 ENCOUNTER — Encounter
Admission: RE | Admit: 2024-06-28 | Discharge: 2024-06-28 | Disposition: A | Discharge status: Home / Self Care | Source: Ambulatory Visit | Attending: Orthopedic Surgery | Admitting: Orthopedic Surgery

## 2024-06-28 ENCOUNTER — Other Ambulatory Visit: Payer: Self-pay

## 2024-06-28 ENCOUNTER — Ambulatory Visit
Admission: RE | Admit: 2024-06-28 | Discharge: 2024-06-28 | Disposition: A | Discharge status: Home / Self Care | Source: Ambulatory Visit | Attending: Orthopedic Surgery | Admitting: Orthopedic Surgery

## 2024-06-28 VITALS — Ht 72.0 in | Wt 232.0 lb

## 2024-06-28 DIAGNOSIS — Z01812 Encounter for preprocedural laboratory examination: Secondary | ICD-10-CM | POA: Insufficient documentation

## 2024-06-28 DIAGNOSIS — Z794 Long term (current) use of insulin: Secondary | ICD-10-CM | POA: Insufficient documentation

## 2024-06-28 DIAGNOSIS — I200 Unstable angina: Secondary | ICD-10-CM | POA: Diagnosis present

## 2024-06-28 DIAGNOSIS — E119 Type 2 diabetes mellitus without complications: Secondary | ICD-10-CM

## 2024-06-28 DIAGNOSIS — J984 Other disorders of lung: Secondary | ICD-10-CM

## 2024-06-28 DIAGNOSIS — M67912 Unspecified disorder of synovium and tendon, left shoulder: Secondary | ICD-10-CM

## 2024-06-28 DIAGNOSIS — I10 Essential (primary) hypertension: Secondary | ICD-10-CM

## 2024-06-28 HISTORY — DX: Obesity, unspecified: E66.9

## 2024-06-28 HISTORY — DX: Atherosclerotic heart disease of native coronary artery without angina pectoris: I25.10

## 2024-06-28 HISTORY — DX: Mixed hyperlipidemia: E78.2

## 2024-06-28 HISTORY — DX: Other long term (current) drug therapy: Z79.899

## 2024-06-28 NOTE — Patient Instructions (Addendum)
 Your procedure is scheduled on: Tuesday 07/04/24  Report to the Registration Desk on the 1st floor of [facility]. To find out your arrival time, please call [phone] between 1PM - 3PM on: Monday 07/03/24 If your arrival time is 6:00 am, do not arrive before that time as the [facility] entrance doors do not open until 6:00 am.  REMEMBER: Instructions that are not followed completely may result in serious medical risk, up to and including death; or upon the discretion of your surgeon and anesthesiologist your surgery may need to be rescheduled.  Do not eat food after midnight the night before surgery.  No gum chewing or hard candies.  You may however, drink CLEAR liquids up to 2 hours before you are scheduled to arrive for your surgery. Do not drink anything within 2 hours of your scheduled arrival time.  Clear liquids include: - water   **Type 1 and Type 2 diabetics should only drink water.**  In addition, your doctor has ordered for you to drink the provided:  Gatorade G2 Drinking this carbohydrate drink up to two hours before surgery helps to reduce insulin  resistance and improve patient outcomes. Please complete drinking 2 hours before scheduled arrival time.  One week prior to surgery: Stop Anti-inflammatories (NSAIDS) such as Advil, Aleve, Ibuprofen, Motrin, Naproxen, Naprosyn and Aspirin  based products such as Excedrin, Goody's Powder, BC Powder. Stop ANY OVER THE COUNTER supplements until after surgery. ascorbic acid  (VITAMIN C ) 1000 MG  b complex vitamins  Black Elderberry 1000 MG  calcium  carbonate (OSCAL) 1500 (600 Ca) MG  Celery Seed OIL  magnesium gluconate (MAGONATE) 500 MG  Misc Natural Products (BEET ROOT PO)  Misc Natural Products (WHITE WILLOW BARK PO)  Multiple Vitamin (MULTIVITAMIN WITH MINERALS)  Omega-3 Fatty Acids (FISH OIL) TURMERIC PO  vitamin E  (VITAMIN E ) 400 UNIT   You may however, continue to take Tylenol  if needed for pain up until the day  of surgery. Stop metFORMIN (GLUCOPHAGE) 1000 MG 2 days before your procedure (take last dose 07/01/24) Stop OZEMPIC, 2 MG/DOSE, 8 MG/3ML SOPN 7days prior to your surgery.  Take half of your normal insulin  glargine (LANTUS) 100 UNIT/ML injection the night before.  **Follow recommendations regarding stopping blood thinners.**  Continue taking all of your other prescription medications up until the day of surgery.  ON THE DAY OF SURGERY ONLY TAKE THESE MEDICATIONS WITH SIPS OF WATER:  gabapentin (NEURONTIN) 600 MG  omeprazole (PRILOSEC OTC) 20 MG    No Alcohol for 24 hours before or after surgery.  No Smoking including e-cigarettes for 24 hours before surgery.  No chewable tobacco products for at least 6 hours before surgery.  No nicotine patches on the day of surgery.  Do not use any recreational drugs for at least a week (preferably 2 weeks) before your surgery.  Please be advised that the combination of cocaine and anesthesia may have negative outcomes, up to and including death. If you test positive for cocaine, your surgery will be cancelled.  On the morning of surgery brush your teeth with toothpaste and water, you may rinse your mouth with mouthwash if you wish. Do not swallow any toothpaste or mouthwash.  Use CHG Soap or wipes as directed on instruction sheet.  Do not wear jewelry, make-up, hairpins, clips or nail polish.  For welded (permanent) jewelry: bracelets, anklets, waist bands, etc.  Please have this removed prior to surgery.  If it is not removed, there is a chance that hospital personnel will  need to cut it off on the day of surgery.  Do not wear lotions, powders, or perfumes.   Do not shave body hair from the neck down 48 hours before surgery.  Contact lenses, hearing aids and dentures may not be worn into surgery.  Do not bring valuables to the hospital. [facility] is not responsible for any missing/lost belongings or valuables.   Total Shoulder  Arthroplasty:  use Benzoyl Peroxide 5% Gel as directed on instruction sheet.  Bring your C-PAP to the hospital in case you may have to spend the night.   Notify your doctor if there is any change in your medical condition (cold, fever, infection).  Wear comfortable clothing (specific to your surgery type) to the hospital.  After surgery, you can help prevent lung complications by doing breathing exercises.  Take deep breaths and cough every 1-2 hours. Your doctor may order a device called an Incentive Spirometer to help you take deep breaths. When coughing or sneezing, hold a pillow firmly against your incision with both hands. This is called "splinting." Doing this helps protect your incision. It also decreases belly discomfort.  If you are being admitted to the hospital overnight, leave your suitcase in the car. After surgery it may be brought to your room.  In case of increased patient census, it may be necessary for you, the patient, to continue your postoperative care in the Same Day Surgery department.  If you are being discharged the day of surgery, you will not be allowed to drive home. You will need a responsible individual to drive you home and stay with you for 24 hours after surgery.   If you are taking public transportation, you will need to have a responsible individual with you.  Please call the Pre-admissions Testing Dept. at [phone] if you have any questions about these instructions.  Surgery Visitation Policy:  Patients having surgery or a procedure may have two visitors.  Children under the age of 63 must have an adult with them who is not the patient.  Inpatient Visitation:    Visiting hours are 7 a.m. to 8 p.m. Up to four visitors are allowed at one time in a patient room. The visitors may rotate out with other people during the day.  One visitor age 64 or older may stay with the patient overnight and must be in the room by 8 p.m.   Passenger transport manager to address health-related social needs:  [email or web address]     Pre-operative 5 CHG Bath Instructions   You can play a key role in reducing the risk of infection after surgery. Your skin needs to be as free of germs as possible. You can reduce the number of germs on your skin by washing with CHG (chlorhexidine  gluconate) soap before surgery. CHG is an antiseptic soap that kills germs and continues to kill germs even after washing.   DO NOT use if you have an allergy to chlorhexidine /CHG or antibacterial soaps. If your skin becomes reddened or irritated, stop using the CHG and notify one of our RNs at [phone].   Please shower with the CHG soap starting 4 days before surgery using the following schedule:     Please keep in mind the following:  DO NOT shave, including legs and underarms, starting the day of your first shower.   You may shave your face at any point before/day of surgery.  Place clean sheets on your bed the day you start using CHG soap. Use a  clean washcloth (not used since being washed) for each shower. DO NOT sleep with pets once you start using the CHG.   CHG Shower Instructions:  If you choose to wash your hair and private area, wash first with your normal shampoo/soap.  After you use shampoo/soap, rinse your hair and body thoroughly to remove shampoo/soap residue.  Turn the water OFF and apply about 3 tablespoons (45 ml) of CHG soap to a CLEAN washcloth.  Apply CHG soap ONLY FROM YOUR NECK DOWN TO YOUR TOES (washing for 3-5 minutes)  DO NOT use CHG soap on face, private areas, open wounds, or sores.  Pay special attention to the area where your surgery is being performed.  If you are having back surgery, having someone wash your back for you may be helpful. Wait 2 minutes after CHG soap is applied, then you may rinse off the CHG soap.  Pat dry with a clean towel  Put on clean clothes/pajamas   If you choose to wear lotion, please use ONLY  the CHG-compatible lotions on the back of this paper.     Additional instructions for the day of surgery: DO NOT APPLY any lotions, deodorants, cologne, or perfumes.   Put on clean/comfortable clothes.  Brush your teeth.  Ask your nurse before applying any prescription medications to the skin.      CHG Compatible Lotions   Aveeno Moisturizing lotion  Cetaphil Moisturizing Cream  Cetaphil Moisturizing Lotion  Clairol Herbal Essence Moisturizing Lotion, Dry Skin  Clairol Herbal Essence Moisturizing Lotion, Extra Dry Skin  Clairol Herbal Essence Moisturizing Lotion, Normal Skin  Curel Age Defying Therapeutic Moisturizing Lotion with Alpha Hydroxy  Curel Extreme Care Body Lotion  Curel Soothing Hands Moisturizing Hand Lotion  Curel Therapeutic Moisturizing Cream, Fragrance-Free  Curel Therapeutic Moisturizing Lotion, Fragrance-Free  Curel Therapeutic Moisturizing Lotion, Original Formula  Eucerin Daily Replenishing Lotion  Eucerin Dry Skin Therapy Plus Alpha Hydroxy Crme  Eucerin Dry Skin Therapy Plus Alpha Hydroxy Lotion  Eucerin Original Crme  Eucerin Original Lotion  Eucerin Plus Crme Eucerin Plus Lotion  Eucerin TriLipid Replenishing Lotion  Keri Anti-Bacterial Hand Lotion  Keri Deep Conditioning Original Lotion Dry Skin Formula Softly Scented  Keri Deep Conditioning Original Lotion, Fragrance Free Sensitive Skin Formula  Keri Lotion Fast Absorbing Fragrance Free Sensitive Skin Formula  Keri Lotion Fast Absorbing Softly Scented Dry Skin Formula  Keri Original Lotion  Keri Skin Renewal Lotion Keri Silky Smooth Lotion  Keri Silky Smooth Sensitive Skin Lotion  Nivea Body Creamy Conditioning Oil  Nivea Body Extra Enriched Lotion  Nivea Body Original Lotion  Nivea Body Sheer Moisturizing Lotion Nivea Crme  Nivea Skin Firming Lotion  NutraDerm 30 Skin Lotion  NutraDerm Skin Lotion  NutraDerm Therapeutic Skin Cream  NutraDerm Therapeutic Skin Lotion  ProShield  Protective Hand Cream  Provon moisturizing lotion  Preparing for Total Shoulder Arthroplasty  Before surgery, you can play an important role by reducing the number of germs on your skin by using the following products:  Benzoyl Peroxide Gel  o Reduces the number of germs present on the skin  o Applied twice a day to shoulder area starting two days before surgery  Chlorhexidine  Gluconate (CHG) Soap  o An antiseptic cleaner that kills germs and bonds with the skin to continue killing germs even after washing  o Used for showering the night before surgery and morning of surgery  BENZOYL PEROXIDE 5% GEL  Please do not use if you have an allergy to benzoyl peroxide. If  your skin becomes reddened/irritated stop using the benzoyl peroxide.  Starting two days before surgery, apply as follows:  1. Apply benzoyl peroxide in the morning and at night. Apply after taking a shower. If you are not taking a shower, clean entire shoulder front, back, and side along with the armpit with a clean wet washcloth.  2. Place a quarter-sized dollop on your shoulder and rub in thoroughly, making sure to cover the front, back, and side of your shoulder, along with the armpit.  2 days before ____ AM ____ PM 1 day before ____ AM ____ PM  3. Do this twice a day for two days. (Last application is the night before surgery, AFTER using the CHG soap).  4. Do NOT apply benzoyl peroxide gel on the day of surgery.  How to Use an Incentive Spirometer  An incentive spirometer is a tool that measures how well you are filling your lungs with each breath. Learning to take long, deep breaths using this tool can help you keep your lungs clear and active. This may help to reverse or lessen your chance of developing breathing (pulmonary) problems, especially infection. You may be asked to use a spirometer: After a surgery. If you have a lung problem or a history of smoking. After a long period of time when you have been  unable to move or be active. If the spirometer includes an indicator to show the highest number that you have reached, your health care provider or respiratory therapist will help you set a goal. Keep a log of your progress as told by your health care provider. What are the risks? Breathing too quickly may cause dizziness or cause you to pass out. Take your time so you do not get dizzy or light-headed. If you are in pain, you may need to take pain medicine before doing incentive spirometry. It is harder to take a deep breath if you are having pain. How to use your incentive spirometer  Sit up on the edge of your bed or on a chair. Hold the incentive spirometer so that it is in an upright position. Before you use the spirometer, breathe out normally. Place the mouthpiece in your mouth. Make sure your lips are closed tightly around it. Breathe in slowly and as deeply as you can through your mouth, causing the piston or the ball to rise toward the top of the chamber. Hold your breath for 3-5 seconds, or for as long as possible. If the spirometer includes a coach indicator, use this to guide you in breathing. Slow down your breathing if the indicator goes above the marked areas. Remove the mouthpiece from your mouth and breathe out normally. The piston or ball will return to the bottom of the chamber. Rest for a few seconds, then repeat the steps 10 or more times. Take your time and take a few normal breaths between deep breaths so that you do not get dizzy or light-headed. Do this every 1-2 hours when you are awake. If the spirometer includes a goal marker to show the highest number you have reached (best effort), use this as a goal to work toward during each repetition. After each set of 10 deep breaths, cough a few times. This will help to make sure that your lungs are clear. If you have an incision on your chest or abdomen from surgery, place a pillow or a rolled-up towel firmly against the  incision when you cough. This can help to reduce pain while taking  deep breaths and coughing. General tips When you are able to get out of bed: Walk around often. Continue to take deep breaths and cough in order to clear your lungs. Keep using the incentive spirometer until your health care provider says it is okay to stop using it. If you have been in the hospital, you may be told to keep using the spirometer at home. Contact a health care provider if: You are having difficulty using the spirometer. You have trouble using the spirometer as often as instructed. Your pain medicine is not giving enough relief for you to use the spirometer as told. You have a fever. Get help right away if: You develop shortness of breath. You develop a cough with bloody mucus from the lungs. You have fluid or blood coming from an incision site after you cough. Summary An incentive spirometer is a tool that can help you learn to take long, deep breaths to keep your lungs clear and active. You may be asked to use a spirometer after a surgery, if you have a lung problem or a history of smoking, or if you have been inactive for a long period of time. Use your incentive spirometer as instructed every 1-2 hours while you are awake. If you have an incision on your chest or abdomen, place a pillow or a rolled-up towel firmly against your incision when you cough. This will help to reduce pain. Get help right away if you have shortness of breath, you cough up bloody mucus, or blood comes from your incision when you cough. This information is not intended to replace advice given to you by your health care provider. Make sure you discuss any questions you have with your health care provider. Document Revised: 03/04/2020 Document Reviewed: 03/04/2020   Please go to the following website to access important education materials concerning your upcoming joint replacement.                                    [email or web address]

## 2024-06-29 ENCOUNTER — Encounter: Payer: Self-pay | Admitting: Orthopedic Surgery

## 2024-06-29 ENCOUNTER — Encounter
Admission: RE | Admit: 2024-06-29 | Discharge: 2024-06-29 | Disposition: A | Discharge status: Home / Self Care | Source: Ambulatory Visit | Attending: Orthopedic Surgery | Admitting: Orthopedic Surgery

## 2024-06-29 DIAGNOSIS — I10 Essential (primary) hypertension: Secondary | ICD-10-CM | POA: Diagnosis not present

## 2024-06-29 DIAGNOSIS — R9431 Abnormal electrocardiogram [ECG] [EKG]: Secondary | ICD-10-CM | POA: Insufficient documentation

## 2024-06-29 DIAGNOSIS — I200 Unstable angina: Secondary | ICD-10-CM | POA: Diagnosis not present

## 2024-06-29 DIAGNOSIS — Z01812 Encounter for preprocedural laboratory examination: Secondary | ICD-10-CM

## 2024-06-29 DIAGNOSIS — J984 Other disorders of lung: Secondary | ICD-10-CM | POA: Insufficient documentation

## 2024-06-29 DIAGNOSIS — Z01810 Encounter for preprocedural cardiovascular examination: Secondary | ICD-10-CM | POA: Diagnosis not present

## 2024-06-29 DIAGNOSIS — Z01818 Encounter for other preprocedural examination: Secondary | ICD-10-CM | POA: Insufficient documentation

## 2024-06-29 LAB — COMPREHENSIVE METABOLIC PANEL WITH GFR
ALT: 19 U/L (ref 0–44)
AST: 26 U/L (ref 15–41)
Albumin: 3.9 g/dL (ref 3.5–5.0)
Alkaline Phosphatase: 59 U/L (ref 38–126)
Anion gap: 10 (ref 5–15)
BUN: 12 mg/dL (ref 8–23)
CO2: 25 mmol/L (ref 22–32)
Calcium: 9.3 mg/dL (ref 8.9–10.3)
Chloride: 103 mmol/L (ref 98–111)
Creatinine, Ser: 0.89 mg/dL (ref 0.61–1.24)
GFR, Estimated: >60 mL/min (ref >60)
Glucose, Bld: 185 mg/dL — ABNORMAL HIGH (ref 70–99)
Potassium: 4.6 mmol/L (ref 3.5–5.1)
Sodium: 138 mmol/L (ref 135–145)
Total Bilirubin: 0.5 mg/dL (ref 0.0–1.2)
Total Protein: 7.5 g/dL (ref 6.5–8.1)

## 2024-06-29 LAB — URINALYSIS, ROUTINE W REFLEX MICROSCOPIC
Bilirubin Urine: NEGATIVE
Glucose, UA: NEGATIVE mg/dL
Hgb urine dipstick: NEGATIVE
Ketones, ur: NEGATIVE mg/dL
Leukocytes,Ua: NEGATIVE
Nitrite: NEGATIVE
Protein, ur: NEGATIVE mg/dL
Specific Gravity, Urine: 1.019 (ref 1.005–1.030)
pH: 6 (ref 5.0–8.0)

## 2024-06-29 LAB — CBC WITH DIFFERENTIAL/PLATELET
Abs Immature Granulocytes: 0.02 10*3/uL (ref 0.00–0.07)
Basophils Absolute: 0.1 10*3/uL (ref 0.0–0.1)
Basophils Relative: 1 %
Eosinophils Absolute: 0.1 10*3/uL (ref 0.0–0.5)
Eosinophils Relative: 3 %
HCT: 37.5 % — ABNORMAL LOW (ref 39.0–52.0)
Hemoglobin: 12.3 g/dL — ABNORMAL LOW (ref 13.0–17.0)
Immature Granulocytes: 0 %
Lymphocytes Relative: 20 %
Lymphs Abs: 1.1 10*3/uL (ref 0.7–4.0)
MCH: 25.5 pg — ABNORMAL LOW (ref 26.0–34.0)
MCHC: 32.8 g/dL (ref 30.0–36.0)
MCV: 77.8 fL — ABNORMAL LOW (ref 80.0–100.0)
Monocytes Absolute: 0.4 10*3/uL (ref 0.1–1.0)
Monocytes Relative: 7 %
Neutro Abs: 3.7 10*3/uL (ref 1.7–7.7)
Neutrophils Relative %: 69 %
Platelets: 298 10*3/uL (ref 150–400)
RBC: 4.82 MIL/uL (ref 4.22–5.81)
RDW: 14.9 % (ref 11.5–15.5)
WBC: 5.3 10*3/uL (ref 4.0–10.5)
nRBC: 0 % (ref 0.0–0.2)

## 2024-06-29 LAB — SURGICAL PCR SCREEN
MRSA, PCR: NEGATIVE
Staphylococcus aureus: NEGATIVE

## 2024-06-29 NOTE — Progress Notes (Signed)
 Perioperative / Anesthesia Services  Pre-Admission Testing Clinical Review / Pre-Operative Anesthesia Consult  Date: 06/29/24  PATIENT DEMOGRAPHICS: Name: Marc Blair DOB: April 17, 1956 MRN:   [id number]  Note: Available PAT nursing documentation and vital signs have been reviewed. Clinical nursing staff has updated patient's PMH/PSHx, current medication list, and drug allergies/intolerances to ensure complete and comprehensive history available to assist care teams in MDM as it pertains to the aforementioned surgical procedure and anticipated anesthetic course. Extensive review of available clinical information personally performed. [place] PMH and PSHx updated with any diagnoses/procedures that  may have been inadvertently omitted during his intake with the pre-admission testing department's nursing staff.  PLANNED SURGICAL PROCEDURE(S):   Case: [id number] Date/Time: 07/04/24 1104   Procedure: ARTHROPLASTY, SHOULDER, TOTAL, REVERSE (Left: Shoulder)   Anesthesia type: Choice   Diagnosis:      Dysfunction of left rotator cuff [M67.912]     S/P left rotator cuff repair [Z98.890]   Pre-op diagnosis:      Dysfunction of left rotator cuff M67.912     S/P left rotator cuff repair Z98.890   Location: [facility] OR ROOM 01 / [facility]   Surgeons: Tobie Priest, MD        CLINICAL DISCUSSION: Marc Blair is a 68 y.o. male who is submitted for pre-surgical anesthesia review and clearance prior to him undergoing the above procedure. Patient is a Former Smoker  (quit 03/2015). Pertinent PMH includes: CAD, MI x 2, ischemic cardiomyopathy, HFrEF, chronic cerebral microvascular disease, aortic atherosclerosis, HTN, HLD, T2DM, emphysema, deviated nasal septum, OSAH (requires nocturnal PAP therapy), GERD (on daily PPI), gastric ulcer, OA, cervical DJD, calcific LEFT rotator cuff tendinitis, diabetic polyneuropathy.   Patient is followed by cardiology Juanice, MD). He was last  seen in the cardiology clinic on 10/19/2023; notes reviewed. At the time of his clinic visit, patient doing well overall from a cardiovascular perspective. Patient denied any chest pain, shortness of breath, PND, orthopnea, palpitations, significant peripheral edema, weakness, fatigue, vertiginous symptoms, or presyncope/syncope. Patient with a past medical history significant for cardiovascular diagnoses. Documented physical exam was grossly benign, providing no evidence of acute exacerbation and/or decompensation of the patient's known cardiovascular conditions. Of note, complete records regarding patient's complete cardiovascular history are unavailable at time of consult. Information gathered from patient/family report, as well as from notes provided by patient's local care providers.   Patient reported to have suffered an MI (unknown type) back in 2002. He subsequently underwent PCI with stent placement (unknown type) to the [facility].    Patient suffered an NSTEMI on 12/05/2015. Troponins were trended: 0.06 --> 0.28 --> 0.78 --> 1.01 ng/mL.    TTE performed on 12/05/2023 revealing mildly reduced left ventricular systolic function with an EF of 40-45%. There were no regional wall motion abnormalities. Right ventricular size and function was norma. Mild mitral and tricuspid valve regurgitation was observed. All transvalvular gradients were noted to be normal providing no evidence suggestive of valvular stenosis. Aorta normal in size with no evidence of aneurysmal dilatation.   Patient underwent diagnostic LEFT heart catheterization on 12/06/2015 revealing multivessel CAD; 100% OM1, 95% ISR mRCA-1, 50% mRCA-2, 30% dRCA-1, 30% dRCA-2, 40% pLCx, and 40% mLAD. PCI was performed placing a 3.0 x 33 mm Xience Alpine DES x 1 to the mRCA-1 lesion. Procedure yielded excellent angiographic result and TIMI-3 flow.   Most recent TTE was performed on 07/29/2021 revealing low normal left ventricular systolic function with  and EF of 50%. There was  anterior, later, and apical hypokinesis. Moderate concentric LVH noted. Left ventricular diastolic Doppler parameters consistent with abnormal relaxation (G1DD). Left atrium was mildly dilated. Trivial tricuspid and pulmonary regurgitation noted. All transvalvular gradients were noted to be normal providing no evidence suggestive of valvular stenosis. Aorta normal in size with no evidence of aneurysmal dilatation.   Blood pressure well controlled at 110/62 mmHg on currently prescribed beta blocker (atenolol ) and ACEi (lisinopril) therapies.  Patient is on rosuvastatin  + Omega-3 fatty acid for his HLD diagnosis and ASCVD prevention. T2DM reasonably controlled on currently prescribed regimen when seen by cardiology; last HgbA1c was 7.3% when checked on 02/16/2023. Of note, since patient was last seen by cardiology, A1c level has been rechecked with further elevation of his A1c level to 7.4%. He does have an OSAH diagnosis and is reported to be compliant with prescribed nocturnal PAP therapy. Patient is able to complete all of his  ADL/IADLs without cardiovascular limitation.  Per the [facility]ASI, patient is able to achieve at least 4 METS of physical activity without experiencing any significant degree of angina/anginal equivalent symptoms. No changes were made to his medication regimen during his visit with cardiology.  Patient scheduled to follow-up with outpatient cardiology in 1 year or sooner if needed.   Marc Blair is scheduled for an elective ARTHROPLASTY, SHOULDER, TOTAL, REVERSE (Left: Shoulder) on 07/04/2024 with Dr. Earnestine Blanch, MD. Given patient's past medical history significant for cardiovascular diagnoses, presurgical cardiac clearance was sought by the PAT team. Per cardiology, this patient is optimized for surgery and may proceed with the planned procedural course with a LOW risk of significant perioperative cardiovascular complications.  In review of the patient's  chart, it is noted that he is on daily oral antithrombotic therapy. Given that patient's past medical history is significant for cardiovascular diagnoses, including but not limited to CAD, orthopedics has cleared patient to continue his daily low dose ASA throughout his perioperative course.  Patient has been updated on these directives from his specialty care providers by the PAT team.  Patient denies previous perioperative complications with anesthesia in the past. In review his EMR, it is noted that patient underwent a general anesthetic course here at [facility] (ASA III) in 12/2023 without documented complications.   MOST RECENT VITAL SIGNS:    06/28/2024    2:39 PM 05/19/2024   11:21 AM 12/31/2023    6:11 PM  Vitals with BMI  Height 6' 0 6' 0   Weight 232 lbs 242 lbs 14 oz   BMI 31.46 32.94   Systolic   127  Diastolic   63  Pulse   70   PROVIDERS/SPECIALISTS: NOTE: Primary physician provider listed below. Patient may have been seen by APP or partner within same practice.   PROVIDER ROLE / SPECIALTY LAST SHERLEAN Blanch Earnestine, MD Orthopedics (Surgeon) 05/08/2024  Gauger, Lauraine Collar, NP Primary Care Provider 05/12/2024  Dewane Shiner, DO Cardiology 10/19/2023   ALLERGIES: Allergies  Allergen Reactions   Sudafed [Pseudoephedrine] Other (See Comments)    Weakness, Diaphoresis   Lipitor [Atorvastatin] Other (See Comments)    FEELS LIKE SOMEONE BEAT THE DAYLIGHTS OUT OF ME    CURRENT HOME MEDICATIONS: No current facility-administered medications for this encounter.    acetaminophen  (TYLENOL ) 500 MG tablet   ascorbic acid  (VITAMIN C ) 1000 MG tablet   aspirin  EC 81 MG tablet   atenolol  (TENORMIN ) 50 MG tablet   b complex vitamins capsule   Black Elderberry 1000 MG  CAPS   calcium  carbonate (OSCAL) 1500 (600 Ca) MG TABS tablet   Celery Seed OIL   Continuous Glucose Receiver (FREESTYLE LIBRE 3 READER) DEVI   ferrous sulfate 325 (65 FE) MG  tablet   gabapentin (NEURONTIN) 300 MG capsule   ibuprofen (ADVIL) 800 MG tablet   insulin  glargine (LANTUS) 100 UNIT/ML injection   lisinopril (ZESTRIL) 10 MG tablet   magnesium gluconate (MAGONATE) 500 MG tablet   metFORMIN (GLUCOPHAGE) 1000 MG tablet   methocarbamol (ROBAXIN) 500 MG tablet   Misc Natural Products (BEET ROOT PO)   Misc Natural Products (WHITE WILLOW BARK PO)   Multiple Vitamin (MULTIVITAMIN WITH MINERALS) TABS tablet   NON FORMULARY   Omega-3 Fatty Acids (FISH OIL PO)   omeprazole (PRILOSEC OTC) 20 MG tablet   ondansetron  (ZOFRAN -ODT) 4 MG disintegrating tablet   OVER THE COUNTER MEDICATION   oxyCODONE  (ROXICODONE ) 5 MG immediate release tablet   OZEMPIC, 2 MG/DOSE, 8 MG/3ML SOPN   Potassium Gluconate 550 MG TABS   rosuvastatin  (CRESTOR ) 20 MG tablet   traMADol (ULTRAM) 50 MG tablet   TURMERIC PO   vitamin E  (VITAMIN E ) 400 UNIT capsule   HISTORY: Past Medical History:  Diagnosis Date   Aortic atherosclerosis (HCC)    Calcific tendinitis of left shoulder    Carpal tunnel syndrome    Cerebral microvascular disease    CHF (congestive heart failure) (HCC)    a.) TTE 12/05/2015: EF 40-45%, no RWMAs, norm RVSF, mild MR/TR; b.) TTE 07/29/2021: EF 50%, ant/lat/apical HK, mod LVH, G1DD, mild LAE, norm RVSF, triv MR/TR/PR   Coronary artery disease 2002   a.) s/p MI 2000 (unknown type) with PCI/stenting mRCA (unknown stent type); b.) s/p NSTEMI 12/05/2015 --> LHC/PCI 100% OM1, 95% ISR mRCA-1 (3.0 x 33 mm Xience Alpine DES), 50% mRCA-2, 30% dRCA-1, 30% dRCA-2, 40% pLCx, 40% mLAD   Coronary artery disease involving native coronary artery of native heart without angina pectoris    Deviated septum    DJD (degenerative joint disease), cervical    Dysfunction of left rotator cuff    Emphysema of lung (HCC)    Encounter for long-term (current) use of medications    Gastric ulcer    in early 20's   GERD (gastroesophageal reflux disease)    HLD (hyperlipidemia)     Hypertension    Ischemic cardiomyopathy    a.) TTE 12/05/2015: EF 40-45%; b.) TTE 07/29/2021: EF 50%   Left carpal tunnel syndrome    Long term current use of aspirin     MI (myocardial infarction) (HCC) 2002   a.) MI type unknown; LHC/PCI mRCA (stent type unknown)   Neuropathy due to secondary diabetes (HCC)    NSTEMI (non-ST elevated myocardial infarction) (HCC) 12/05/2015   a.) troponins were trended: 0.06 --> 0.28 --> 0.78 --> 1.01 ng/mL; b.) LHC/PCI: 100% OM1, 95% ISR mRCA-1, 50% mRCA-2, 30% dRCA-1, 30% dRCA-2, 40% pLCx, 40% mLAD   Obesity    OSA on CPAP    Osteoarthritis    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)    Type 2 diabetes mellitus with diabetic polyneuropathy, with long-term current use of insulin  [facility])    Past Surgical History:  Procedure Laterality Date   CARDIAC CATHETERIZATION N/A 12/06/2015   Procedure: Left Heart Cath and Coronary Angiography;  Surgeon: Marsa Dooms, MD;  Location: [facility] INVASIVE CV LAB;  Service: Cardiovascular;  Laterality: N/A;   CARDIAC CATHETERIZATION N/A 12/06/2015   Procedure: Coronary Stent Intervention;  Surgeon: Marsa Dooms, MD;  Location: [facility] INVASIVE CV LAB;  Service: Cardiovascular;  Laterality: N/A;   COLONOSCOPY WITH PROPOFOL  N/A 07/19/2020   Procedure: COLONOSCOPY WITH PROPOFOL ;  Surgeon: Maryruth Ole DASEN, MD;  Location: [facility] ENDOSCOPY;  Service: Endoscopy;  Laterality: N/A;   CORONARY ANGIOPLASTY     2002, 2016   CORONARY ANGIOPLASTY WITH STENT PLACEMENT Left 2002   Procedure: CORONARY ANGIOPLASTY WITH STENT PLACEMENT (mid RCA); Location: [facility]   MULTIPLE TOOTH EXTRACTIONS     SHOULDER ARTHROSCOPY Left 12/31/2023   Procedure: Left arthroscopic subscapularis and supraspinatus repair, calcific deposit excision, and distal clavicle excision;  Surgeon: Tobie Priest, MD;  Location: [facility] ORS;  Service: Orthopedics;  Laterality: Left;   No family history on file. Social History   Tobacco Use    Smoking status: Former    Current packs/day: 0.00    Types: Cigarettes    Quit date: 04/05/2015    Years since quitting: 9.2   Smokeless tobacco: Never   Tobacco comments:    Last quit 2018  Substance Use Topics   Alcohol use: Yes    Alcohol/week: 1.0 standard drink of alcohol    Types: 1 Cans of beer per week    Comment: OCCASIONAL BEER   LABS:  Hospital Outpatient Visit on 06/29/2024  Component Date Value Ref Range Status   WBC 06/29/2024 5.3  4.0 - 10.5 K/uL Final   RBC 06/29/2024 4.82  4.22 - 5.81 MIL/uL Final   Hemoglobin 06/29/2024 12.3 (L)  13.0 - 17.0 g/dL Final   HCT 92/96/7974 37.5 (L)  39.0 - 52.0 % Final   MCV 06/29/2024 77.8 (L)  80.0 - 100.0 fL Final   MCH 06/29/2024 25.5 (L)  26.0 - 34.0 pg Final   MCHC 06/29/2024 32.8  30.0 - 36.0 g/dL Final   RDW 92/96/7974 14.9  11.5 - 15.5 % Final   Platelets 06/29/2024 298  150 - 400 K/uL Final   nRBC 06/29/2024 0.0  0.0 - 0.2 % Final   Neutrophils Relative % 06/29/2024 69  % Final   Neutro Abs 06/29/2024 3.7  1.7 - 7.7 K/uL Final   Lymphocytes Relative 06/29/2024 20  % Final   Lymphs Abs 06/29/2024 1.1  0.7 - 4.0 K/uL Final   Monocytes Relative 06/29/2024 7  % Final   Monocytes Absolute 06/29/2024 0.4  0.1 - 1.0 K/uL Final   Eosinophils Relative 06/29/2024 3  % Final   Eosinophils Absolute 06/29/2024 0.1  0.0 - 0.5 K/uL Final   Basophils Relative 06/29/2024 1  % Final   Basophils Absolute 06/29/2024 0.1  0.0 - 0.1 K/uL Final   Immature Granulocytes 06/29/2024 0  % Final   Abs Immature Granulocytes 06/29/2024 0.02  0.00 - 0.07 K/uL Final   Performed at [facility], [address] [place], [place] [address]   Sodium 06/29/2024 138  135 - 145 mmol/L Final   Potassium 06/29/2024 4.6  3.5 - 5.1 mmol/L Final   Chloride 06/29/2024 103  98 - 111 mmol/L Final   CO2 06/29/2024 25  22 - 32 mmol/L Final   Glucose, Bld 06/29/2024 185 (H)  70 - 99 mg/dL Final   Glucose reference range applies only to samples taken after  fasting for at least 8 hours.   BUN 06/29/2024 12  8 - 23 mg/dL Final   Creatinine, Ser 06/29/2024 0.89  0.61 - 1.24 mg/dL Final   Calcium  06/29/2024 9.3  8.9 - 10.3 mg/dL Final   Total Protein 92/96/7974 7.5  6.5 - 8.1 g/dL  Final   Albumin 06/29/2024 3.9  3.5 - 5.0 g/dL Final   AST 92/96/7974 26  15 - 41 U/L Final   ALT 06/29/2024 19  0 - 44 U/L Final   Alkaline Phosphatase 06/29/2024 59  38 - 126 U/L Final   Total Bilirubin 06/29/2024 0.5  0.0 - 1.2 mg/dL Final   GFR, Estimated 06/29/2024 >60  >60 mL/min Final   Comment: (NOTE) Calculated using the CKD-EPI Creatinine Equation (2021)    Anion gap 06/29/2024 10  5 - 15 Final   Performed at [facility], [address] [place], [place] [address]   Color, Urine 06/29/2024 YELLOW (A)  YELLOW Final   APPearance 06/29/2024 CLEAR (A)  CLEAR Final   Specific Gravity, Urine 06/29/2024 1.019  1.005 - 1.030 Final   pH 06/29/2024 6.0  5.0 - 8.0 Final   Glucose, UA 06/29/2024 NEGATIVE  NEGATIVE mg/dL Final   Hgb urine dipstick 06/29/2024 NEGATIVE  NEGATIVE Final   Bilirubin Urine 06/29/2024 NEGATIVE  NEGATIVE Final   Ketones, ur 06/29/2024 NEGATIVE  NEGATIVE mg/dL Final   Protein, ur 92/96/7974 NEGATIVE  NEGATIVE mg/dL Final   Nitrite 92/96/7974 NEGATIVE  NEGATIVE Final   Leukocytes,Ua 06/29/2024 NEGATIVE  NEGATIVE Final   Performed at [facility], [address] [place], [place] [address]   MRSA, PCR 06/29/2024 NEGATIVE  NEGATIVE Final   Staphylococcus aureus 06/29/2024 NEGATIVE  NEGATIVE Final   Comment: (NOTE) The Xpert SA Assay (FDA approved for NASAL specimens in patients 73 years of age and older), is one component of a comprehensive surveillance program. It is not intended to diagnose infection nor to guide or monitor treatment. Performed at [facility], [address] [place], [place] [address]     ECG: Date: 06/29/2024  Time ECG obtained: 1004 AM Rate: 73 bpm Rhythm: normal sinus Axis  (leads I and aVF): normal Intervals: PR 158 ms. QRS 100 ms. QTc 403 ms. ST segment and T wave changes: No evidence of acute T wave abnormalities or significant ST segment elevation or depression.  Evidence of a possible, age undetermined, prior infarct:  No Comparison: Similar to previous tracing obtained on 10/19/2023   IMAGING / PROCEDURES: CT SHOULDER LEFT WO CONTRAST performed on 06/28/2024 Mild to moderate glenohumeral joint osteoarthritis High-grade atrophy and fatty infiltration of the superior greater than inferior aspect of the subscapularis muscle, similar to prior MRI 06/25/2023. Mild to moderate anterior supraspinatus muscle atrophy is mildly worsened from prior MRI. Likely full-thickness anterior supraspinatus tendon footprint tear. Moderate fluid within the subacromial/subdeltoid bursa. Likely postsurgical changes of partial distal clavicle excision, new from 06/25/2023 MRI. Aortic atherosclerosis   CT CHEST LUNG CA SCREEN LOW DOSE W/O CM performed on 11/02/2023 Lung-RADS 2, benign appearance or behavior. Continue annual screening with low-dose chest CT without contrast in 12 months. Coronary artery calcifications. Aortic atherosclerosis  Emphysema     TRANSTHORACIC ECHOCARDIOGRAM performed on 07/29/2021 Low normal left ventricular systolic function with an EF of 50% Anterior, lateral, and apical hypokinesis Moderate concentric LVH Left ventricular diastolic Doppler parameters consistent with abnormal relaxation (G1DD). Mild LAE Normal right ventricular systolic function Trivial mitral, tricuspid, and pulmonary valve regurgitation Normal transvalvular gradients; no valvular stenosis No pericardial effusion   LEFT HEART CATHETERIZATION AND CORONARY ANGIOGRAPHY performed on 12/06/2015 Two-vessel coronary artery disease with chronically occluded OM1 and high-grade in-stent restenosis mid RC 1st Mrg lesion, 100% stenosed Mid RCA-2 lesion, 50% stenosed Mid RCA-1 lesion,  95% stenosed. Post intervention, there is a 0% residual  stenosis. Dist RCA-1 lesion, 30% stenosed. Dist RCA-2 lesion, 30% stenosed. Prox Cx lesion, 40% stenosed. Mid LAD lesion, 40% stenosed. Moderately reduced left ventricular function with inferior wall akinesis Successful PCI with 3.0 x 33 mm Xience Alpine stent mid RCA       IMPRESSION AND PLAN: Marc Blair has been referred for pre-anesthesia review and clearance prior to him undergoing the planned anesthetic and procedural courses. Available labs, pertinent testing, and imaging results were personally reviewed by me in preparation for upcoming operative/procedural course. The [facility] medical record has been updated following extensive record review and patient interview with PAT staff.   This patient has been appropriately cleared by cardiology with an overall LOW risk of patient experiencing significant perioperative cardiovascular complications. Based on clinical review performed today (06/29/24), barring any significant acute changes in the patient's overall condition, it is anticipated that he will be able to proceed with the planned surgical intervention. Any acute changes in clinical condition may necessitate his procedure being postponed and/or cancelled. Patient will meet with anesthesia team (MD and/or CRNA) on the day of his procedure for preoperative evaluation/assessment. Questions regarding anesthetic course will be fielded at that time.   Pre-surgical instructions were reviewed with the patient during his PAT appointment, and questions were fielded to satisfaction by PAT clinical staff. He has been instructed on which medications that he will need to hold prior to surgery, as well as the ones that have been deemed safe/appropriate to take on the day of his procedure. As part of the general education provided by PAT, patient made aware both verbally and in writing, that he would need to abstain from the use of any illegal  substances during his perioperative course. He was advised that failure to follow the provided instructions could necessitate case cancellation or result in serious perioperative complications up to and including death. Patient encouraged to contact PAT and/or his surgeon's office to discuss any questions or concerns that may arise prior to surgery; verbalized understanding.   Dorise Pereyra, MSN, APRN, FNP-C, CEN [facility]  Perioperative Services Nurse Practitioner Phone: [phone] Fax: [phone] 06/29/24 12:51 PM  NOTE: This note has been prepared using Dragon dictation software. Despite my best ability to proofread, there is always the potential that unintentional transcriptional errors may still occur from this process.

## 2024-07-03 ENCOUNTER — Other Ambulatory Visit: Payer: Self-pay | Admitting: Orthopedic Surgery

## 2024-07-04 HISTORY — DX: Carpal tunnel syndrome, unspecified upper limb: G56.00

## 2024-07-04 HISTORY — DX: Unspecified osteoarthritis, unspecified site: M19.90

## 2024-07-10 MED ORDER — TRANEXAMIC ACID-NACL 1000-0.7 MG/100ML-% IV SOLN
1000.0000 mg | INTRAVENOUS | Status: AC
  Administered 2024-07-11: 1000 mg via INTRAVENOUS

## 2024-07-10 MED ORDER — CEFAZOLIN SODIUM-DEXTROSE 2-4 GM/100ML-% IV SOLN
2.0000 g | INTRAVENOUS | Status: AC
  Administered 2024-07-11 (×2): 2 g via INTRAVENOUS

## 2024-07-10 MED ORDER — CHLORHEXIDINE GLUCONATE 0.12 % MT SOLN
15.0000 mL | Freq: Once | OROMUCOSAL | Status: AC
  Administered 2024-07-11: 15 mL via OROMUCOSAL

## 2024-07-10 MED ORDER — SODIUM CHLORIDE 0.9 % IV SOLN: INTRAVENOUS | Status: DC

## 2024-07-10 MED ORDER — ORAL CARE MOUTH RINSE: 15.0000 mL | Freq: Once | OROMUCOSAL | Status: AC

## 2024-07-11 ENCOUNTER — Other Ambulatory Visit: Payer: Self-pay

## 2024-07-11 ENCOUNTER — Ambulatory Visit
Admission: RE | Admit: 2024-07-11 | Discharge: 2024-07-11 | Disposition: A | Discharge status: Home / Self Care | Attending: Orthopedic Surgery | Admitting: Orthopedic Surgery

## 2024-07-11 ENCOUNTER — Ambulatory Visit: Payer: Self-pay | Admitting: Urgent Care

## 2024-07-11 ENCOUNTER — Ambulatory Visit

## 2024-07-11 ENCOUNTER — Encounter
Admission: RE | Disposition: A | Discharge status: Home / Self Care | Payer: Self-pay | Source: Home / Self Care | Attending: Orthopedic Surgery

## 2024-07-11 ENCOUNTER — Encounter: Payer: Self-pay | Admitting: Orthopedic Surgery

## 2024-07-11 DIAGNOSIS — E119 Type 2 diabetes mellitus without complications: Secondary | ICD-10-CM | POA: Insufficient documentation

## 2024-07-11 DIAGNOSIS — M75122 Complete rotator cuff tear or rupture of left shoulder, not specified as traumatic: Secondary | ICD-10-CM | POA: Insufficient documentation

## 2024-07-11 DIAGNOSIS — I081 Rheumatic disorders of both mitral and tricuspid valves: Secondary | ICD-10-CM | POA: Diagnosis not present

## 2024-07-11 DIAGNOSIS — K219 Gastro-esophageal reflux disease without esophagitis: Secondary | ICD-10-CM | POA: Diagnosis not present

## 2024-07-11 DIAGNOSIS — Z794 Long term (current) use of insulin: Secondary | ICD-10-CM

## 2024-07-11 DIAGNOSIS — I252 Old myocardial infarction: Secondary | ICD-10-CM | POA: Insufficient documentation

## 2024-07-11 DIAGNOSIS — I5022 Chronic systolic (congestive) heart failure: Secondary | ICD-10-CM | POA: Insufficient documentation

## 2024-07-11 DIAGNOSIS — M1990 Unspecified osteoarthritis, unspecified site: Secondary | ICD-10-CM | POA: Diagnosis not present

## 2024-07-11 DIAGNOSIS — Z9889 Other specified postprocedural states: Secondary | ICD-10-CM | POA: Diagnosis present

## 2024-07-11 DIAGNOSIS — J439 Emphysema, unspecified: Secondary | ICD-10-CM | POA: Diagnosis not present

## 2024-07-11 DIAGNOSIS — I700 Atherosclerosis of aorta: Secondary | ICD-10-CM | POA: Insufficient documentation

## 2024-07-11 DIAGNOSIS — I110 Hypertensive heart disease with heart failure: Secondary | ICD-10-CM | POA: Diagnosis not present

## 2024-07-11 DIAGNOSIS — G4733 Obstructive sleep apnea (adult) (pediatric): Secondary | ICD-10-CM | POA: Diagnosis not present

## 2024-07-11 DIAGNOSIS — Z87891 Personal history of nicotine dependence: Secondary | ICD-10-CM | POA: Insufficient documentation

## 2024-07-11 DIAGNOSIS — I2510 Atherosclerotic heart disease of native coronary artery without angina pectoris: Secondary | ICD-10-CM | POA: Diagnosis not present

## 2024-07-11 DIAGNOSIS — Z955 Presence of coronary angioplasty implant and graft: Secondary | ICD-10-CM | POA: Diagnosis not present

## 2024-07-11 DIAGNOSIS — M67912 Unspecified disorder of synovium and tendon, left shoulder: Secondary | ICD-10-CM | POA: Diagnosis present

## 2024-07-11 HISTORY — PX: REVERSE SHOULDER ARTHROPLASTY: SHX5054

## 2024-07-11 LAB — GLUCOSE, CAPILLARY
Glucose-Capillary: 173 mg/dL — ABNORMAL HIGH (ref 70–99)
Glucose-Capillary: 213 mg/dL — ABNORMAL HIGH (ref 70–99)

## 2024-07-11 SURGERY — ARTHROPLASTY, SHOULDER, TOTAL, REVERSE
Anesthesia: General | Site: Shoulder | Laterality: Left

## 2024-07-11 MED ORDER — HYDROMORPHONE HCL 1 MG/ML IJ SOLN
0.5000 mg | INTRAMUSCULAR | Status: AC | PRN
  Administered 2024-07-11 (×4): 0.5 mg via INTRAVENOUS

## 2024-07-11 MED ORDER — KETOROLAC TROMETHAMINE 0.5 % OP SOLN
1.0000 [drp] | Freq: Four times a day (QID) | OPHTHALMIC | Status: DC
  Filled 2024-07-11: qty 3

## 2024-07-11 MED ORDER — TRANEXAMIC ACID-NACL 1000-0.7 MG/100ML-% IV SOLN
1000.0000 mg | INTRAVENOUS | Status: AC
  Administered 2024-07-11: 1000 mg via INTRAVENOUS

## 2024-07-11 MED ORDER — TRANEXAMIC ACID-NACL 1000-0.7 MG/100ML-% IV SOLN
INTRAVENOUS | Status: AC
  Filled 2024-07-11: qty 100

## 2024-07-11 MED ORDER — FENTANYL CITRATE (PF) 100 MCG/2ML IJ SOLN
INTRAMUSCULAR | Status: DC | PRN
  Administered 2024-07-11 (×2): 50 ug via INTRAVENOUS

## 2024-07-11 MED ORDER — EPHEDRINE SULFATE-NACL 50-0.9 MG/10ML-% IV SOSY
PREFILLED_SYRINGE | INTRAVENOUS | Status: DC | PRN
  Administered 2024-07-11: 10 mg via INTRAVENOUS

## 2024-07-11 MED ORDER — INSULIN ASPART 100 UNIT/ML IJ SOLN
INTRAMUSCULAR | Status: AC
Start: 2024-07-11 — End: 2024-07-11
  Filled 2024-07-11: qty 1

## 2024-07-11 MED ORDER — BUPIVACAINE LIPOSOME 1.3 % IJ SUSP
INTRAMUSCULAR | Status: AC
  Filled 2024-07-11: qty 20

## 2024-07-11 MED ORDER — ONDANSETRON 4 MG PO TBDP
4.0000 mg | ORAL_TABLET | Freq: Three times a day (TID) | ORAL | 0 refills | Status: AC | PRN
Start: 2024-07-11 — End: ?

## 2024-07-11 MED ORDER — OXYCODONE HCL 5 MG PO TABS: 5.0000 mg | ORAL_TABLET | ORAL | 0 refills | Status: AC | PRN

## 2024-07-11 MED ORDER — SUGAMMADEX SODIUM 200 MG/2ML IV SOLN
INTRAVENOUS | Status: DC | PRN
  Administered 2024-07-11: 200 mg via INTRAVENOUS
  Administered 2024-07-11 (×2): 100 mg via INTRAVENOUS

## 2024-07-11 MED ORDER — SEVOFLURANE IN SOLN
RESPIRATORY_TRACT | Status: AC
  Filled 2024-07-11: qty 250

## 2024-07-11 MED ORDER — MIDAZOLAM HCL 2 MG/2ML IJ SOLN
1.0000 mg | INTRAMUSCULAR | Status: DC | PRN
  Administered 2024-07-11: 1 mg via INTRAVENOUS

## 2024-07-11 MED ORDER — DROPERIDOL 2.5 MG/ML IJ SOLN
0.6250 mg | Freq: Once | INTRAMUSCULAR | Status: AC
  Administered 2024-07-11: 0.625 mg via INTRAVENOUS

## 2024-07-11 MED ORDER — FENTANYL CITRATE (PF) 100 MCG/2ML IJ SOLN
25.0000 ug | INTRAMUSCULAR | Status: DC | PRN
  Administered 2024-07-11 (×3): 50 ug via INTRAVENOUS

## 2024-07-11 MED ORDER — CEFAZOLIN SODIUM-DEXTROSE 2-4 GM/100ML-% IV SOLN
INTRAVENOUS | Status: AC
Start: 2024-07-11 — End: 2024-07-11
  Filled 2024-07-11: qty 100

## 2024-07-11 MED ORDER — 0.9 % SODIUM CHLORIDE (POUR BTL) OPTIME
TOPICAL | Status: DC | PRN
  Administered 2024-07-11: 500 mL

## 2024-07-11 MED ORDER — ASPIRIN 325 MG PO TBEC: 325.0000 mg | DELAYED_RELEASE_TABLET | Freq: Every day | ORAL | 0 refills | Status: AC

## 2024-07-11 MED ORDER — OXYCODONE HCL 5 MG/5ML PO SOLN: 5.0000 mg | Freq: Once | ORAL | Status: AC | PRN

## 2024-07-11 MED ORDER — BUPIVACAINE HCL (PF) 0.5 % IJ SOLN
INTRAMUSCULAR | Status: AC
  Filled 2024-07-11: qty 10

## 2024-07-11 MED ORDER — BUPIVACAINE HCL (PF) 0.5 % IJ SOLN
INTRAMUSCULAR | Status: DC | PRN
Start: 2024-07-11 — End: 2024-07-11
  Administered 2024-07-11: 10 mL

## 2024-07-11 MED ORDER — FENTANYL CITRATE (PF) 100 MCG/2ML IJ SOLN
INTRAMUSCULAR | Status: AC
  Filled 2024-07-11: qty 2

## 2024-07-11 MED ORDER — FENTANYL CITRATE (PF) 100 MCG/2ML IJ SOLN
INTRAMUSCULAR | Status: AC
Start: 2024-07-11 — End: 2024-07-11
  Filled 2024-07-11: qty 2

## 2024-07-11 MED ORDER — HYDROMORPHONE HCL 1 MG/ML IJ SOLN
INTRAMUSCULAR | Status: AC
Start: 2024-07-11 — End: 2024-07-11
  Filled 2024-07-11: qty 1

## 2024-07-11 MED ORDER — BUPIVACAINE LIPOSOME 1.3 % IJ SUSP
INTRAMUSCULAR | Status: DC | PRN
  Administered 2024-07-11: 20 mL

## 2024-07-11 MED ORDER — VANCOMYCIN HCL 1000 MG IV SOLR
INTRAVENOUS | Status: AC
Start: 2024-07-11 — End: 2024-07-11
  Filled 2024-07-11: qty 20

## 2024-07-11 MED ORDER — ACETAMINOPHEN 10 MG/ML IV SOLN
INTRAVENOUS | Status: DC | PRN
  Administered 2024-07-11: 1000 mg via INTRAVENOUS

## 2024-07-11 MED ORDER — DEXMEDETOMIDINE HCL IN NACL 80 MCG/20ML IV SOLN
INTRAVENOUS | Status: DC | PRN
  Administered 2024-07-11 (×3): 4 ug via INTRAVENOUS

## 2024-07-11 MED ORDER — CHLORHEXIDINE GLUCONATE 0.12 % MT SOLN
OROMUCOSAL | Status: AC
  Filled 2024-07-11: qty 15

## 2024-07-11 MED ORDER — INSULIN ASPART 100 UNIT/ML IJ SOLN
5.0000 [IU] | Freq: Once | INTRAMUSCULAR | Status: AC
  Administered 2024-07-11: 5 [IU] via SUBCUTANEOUS

## 2024-07-11 MED ORDER — ONDANSETRON HCL 4 MG/2ML IJ SOLN
INTRAMUSCULAR | Status: DC | PRN
  Administered 2024-07-11: 4 mg via INTRAVENOUS

## 2024-07-11 MED ORDER — CEFAZOLIN SODIUM-DEXTROSE 2-4 GM/100ML-% IV SOLN: 2.0000 g | Freq: Once | INTRAVENOUS | Status: DC

## 2024-07-11 MED ORDER — PHENYLEPHRINE HCL-NACL 20-0.9 MG/250ML-% IV SOLN
INTRAVENOUS | Status: DC | PRN
  Administered 2024-07-11: 10 ug/min via INTRAVENOUS

## 2024-07-11 MED ORDER — MIDAZOLAM HCL 2 MG/2ML IJ SOLN
INTRAMUSCULAR | Status: AC
  Filled 2024-07-11: qty 2

## 2024-07-11 MED ORDER — PROPOFOL 10 MG/ML IV BOLUS
INTRAVENOUS | Status: DC | PRN
  Administered 2024-07-11: 130 mg via INTRAVENOUS

## 2024-07-11 MED ORDER — ACETAMINOPHEN 500 MG PO TABS
1000.0000 mg | ORAL_TABLET | Freq: Three times a day (TID) | ORAL | 2 refills | Status: AC
Start: 2024-07-11 — End: 2025-07-11

## 2024-07-11 MED ORDER — GLYCOPYRROLATE 0.2 MG/ML IJ SOLN
INTRAMUSCULAR | Status: DC | PRN
Start: 2024-07-11 — End: 2024-07-11
  Administered 2024-07-11: .2 mg via INTRAVENOUS

## 2024-07-11 MED ORDER — DEXAMETHASONE SODIUM PHOSPHATE 10 MG/ML IJ SOLN
INTRAMUSCULAR | Status: DC | PRN
  Administered 2024-07-11: 10 mg via INTRAVENOUS

## 2024-07-11 MED ORDER — VANCOMYCIN HCL 1000 MG IV SOLR
INTRAVENOUS | Status: DC | PRN
  Administered 2024-07-11: 1000 mg via TOPICAL

## 2024-07-11 MED ORDER — ROCURONIUM BROMIDE 100 MG/10ML IV SOLN
INTRAVENOUS | Status: DC | PRN
  Administered 2024-07-11: 60 mg via INTRAVENOUS
  Administered 2024-07-11: 20 mg via INTRAVENOUS

## 2024-07-11 MED ORDER — OXYCODONE HCL 5 MG PO TABS
5.0000 mg | ORAL_TABLET | Freq: Once | ORAL | Status: AC | PRN
  Administered 2024-07-11: 5 mg via ORAL

## 2024-07-11 MED ORDER — OXYCODONE HCL 5 MG PO TABS
ORAL_TABLET | ORAL | Status: AC
  Filled 2024-07-11: qty 1

## 2024-07-11 MED ORDER — HYDROMORPHONE HCL 1 MG/ML IJ SOLN
INTRAMUSCULAR | Status: AC
  Filled 2024-07-11: qty 1

## 2024-07-11 MED ORDER — CEFAZOLIN SODIUM-DEXTROSE 2-4 GM/100ML-% IV SOLN
INTRAVENOUS | Status: AC
  Filled 2024-07-11: qty 100

## 2024-07-11 MED ORDER — DROPERIDOL 2.5 MG/ML IJ SOLN
INTRAMUSCULAR | Status: AC
  Filled 2024-07-11: qty 2

## 2024-07-11 MED ORDER — TETRACAINE HCL 0.5 % OP SOLN
1.0000 [drp] | Freq: Once | OPHTHALMIC | Status: DC
  Filled 2024-07-11: qty 4

## 2024-07-11 MED ORDER — FENTANYL CITRATE PF 50 MCG/ML IJ SOSY
PREFILLED_SYRINGE | INTRAMUSCULAR | Status: AC
Start: 2024-07-11 — End: 2024-07-11
  Filled 2024-07-11: qty 1

## 2024-07-11 MED ORDER — SODIUM CHLORIDE 0.9 % IR SOLN
Status: DC | PRN
  Administered 2024-07-11: 3000 mL

## 2024-07-11 MED ORDER — FENTANYL CITRATE PF 50 MCG/ML IJ SOSY
50.0000 ug | PREFILLED_SYRINGE | Freq: Once | INTRAMUSCULAR | Status: AC
  Administered 2024-07-11: 50 ug via INTRAVENOUS

## 2024-07-11 MED ORDER — IRRISEPT - 450ML BOTTLE WITH 0.05% CHG IN STERILE WATER, USP 99.95% OPTIME
TOPICAL | Status: DC | PRN
Start: 2024-07-11 — End: 2024-07-11
  Administered 2024-07-11: 450 mL

## 2024-07-11 SURGICAL SUPPLY — 62 items
BASEPLATE GLEN ALTIVATE 6.5 (Joint) IMPLANT
BIT DRILL 12.7X2STRG SHNK (BIT) IMPLANT
BLADE SAGITTAL WIDE XTHICK NO (BLADE) ×2 IMPLANT
CHLORAPREP W/TINT 26 (MISCELLANEOUS) ×2 IMPLANT
CNTNR URN SCR LID CUP LEK RST (MISCELLANEOUS) IMPLANT
COOLER POLAR GLACIER W/PUMP (MISCELLANEOUS) ×2 IMPLANT
DERMABOND ADVANCED .7 DNX12 (GAUZE/BANDAGES/DRESSINGS) IMPLANT
DRAPE INCISE IOBAN 66X45 STRL (DRAPES) ×2 IMPLANT
DRAPE SHEET LG 3/4 BI-LAMINATE (DRAPES) ×2 IMPLANT
DRAPE TABLE BACK 80X90 (DRAPES) ×2 IMPLANT
DRILL GLEN ALTIVATE 3.5 (DRILL) IMPLANT
DRSG OPSITE POSTOP 4X8 (GAUZE/BANDAGES/DRESSINGS) ×2 IMPLANT
DRSG TEGADERM 2-3/8X2-3/4 SM (GAUZE/BANDAGES/DRESSINGS) ×2 IMPLANT
ELECTRODE REM PT RTRN 9FT ADLT (ELECTROSURGICAL) ×2 IMPLANT
EVACUATOR 1/8 PVC DRAIN (DRAIN) ×2 IMPLANT
GAUZE SPONGE 2X2 STRL 8-PLY (GAUZE/BANDAGES/DRESSINGS) ×2 IMPLANT
GAUZE XEROFORM 1X8 LF (GAUZE/BANDAGES/DRESSINGS) IMPLANT
GLOVE BIOGEL PI IND STRL 8 (GLOVE) ×4 IMPLANT
GLOVE PI ULTRA LF STRL 7.5 (GLOVE) ×4 IMPLANT
GLOVE SURG ORTHO 8.0 STRL STRW (GLOVE) ×4 IMPLANT
GLOVE SURG SYN 8.0 PF PI (GLOVE) ×2 IMPLANT
GOWN SRG LRG LVL 4 IMPRV REINF (GOWNS) ×4 IMPLANT
GOWN SRG XL LONG LVL 3 NONREIN (GOWNS) ×2 IMPLANT
GUIDE WIRE ALTIVATE 2.4X228 SL (WIRE) IMPLANT
GUIDEWIRE GLENOID 2.5X220 (WIRE) IMPLANT
HEAD GLENOID W/SCREW 32MM (Shoulder) IMPLANT
HOOD PEEL AWAY T7 (MISCELLANEOUS) ×6 IMPLANT
INSERT EPOLYSTD HUMERUS 36MM (Shoulder) IMPLANT
KIT STABILIZATION SHOULDER (MISCELLANEOUS) ×2 IMPLANT
LAVAGE JET IRRISEPT WOUND (IRRIGATION / IRRIGATOR) IMPLANT
MANIFOLD NEPTUNE II (INSTRUMENTS) ×2 IMPLANT
MASK FACE SPIDER DISP (MASK) ×2 IMPLANT
MAT ABSORB FLUID 56X50 GRAY (MISCELLANEOUS) ×2 IMPLANT
NDL REVERSE CUT 1/2 CRC (NEEDLE) IMPLANT
NEEDLE REVERSE CUT 1/2 CRC (NEEDLE) IMPLANT
NS IRRIG 500ML POUR BTL (IV SOLUTION) ×2 IMPLANT
PACK ARTHROSCOPY SHOULDER (MISCELLANEOUS) ×2 IMPLANT
PAD WRAPON POLAR SHDR XLG (MISCELLANEOUS) ×2 IMPLANT
PENCIL SMOKE EVACUATOR (MISCELLANEOUS) ×2 IMPLANT
SCREW CENTR ALTIVATE 6.5X40 (Screw) IMPLANT
SCREW PERI ALTIVATE REV 26 (Screw) IMPLANT
SCREW PERI ALTIVATE REV 30 (Screw) IMPLANT
SCREW PERI ALTIVATE REV 34 (Screw) IMPLANT
SCREW PERI ALTIVATE REV 38 (Screw) IMPLANT
SLING ULTRA II LG (MISCELLANEOUS) IMPLANT
SOL .9 NS 3000ML IRR UROMATIC (IV SOLUTION) ×2 IMPLANT
SPONGE T-LAP 18X18 ~~LOC~~+RFID (SPONGE) ×4 IMPLANT
STAPLER SKIN PROX 35W (STAPLE) IMPLANT
STEM HUMERAL 12X48 STD SHORT (Shoulder) IMPLANT
STRAP SAFETY 5IN WIDE (MISCELLANEOUS) ×2 IMPLANT
SUT MNCRL AB 4-0 PS2 18 (SUTURE) IMPLANT
SUT PROLENE 6 0 P 1 18 (SUTURE) IMPLANT
SUT TICRON 2-0 30IN 311381 (SUTURE) ×4 IMPLANT
SUT VIC AB 0 CT1 36 (SUTURE) ×2 IMPLANT
SUT VIC AB 2-0 CT2 27 (SUTURE) ×4 IMPLANT
SUTURE ETHBND 5-0 MS/4 CCS GRN (SUTURE) ×2 IMPLANT
SUTURE FIBERWR #2 38 BLUE 1/2 (SUTURE) ×8 IMPLANT
SWAB CULTURE AMIES ANAERIB BLU (MISCELLANEOUS) IMPLANT
TAP CANN GLEN 6.5 (TAP) IMPLANT
TIP FAN IRRIG PULSAVAC PLUS (DISPOSABLE) ×2 IMPLANT
TRAP FLUID SMOKE EVACUATOR (MISCELLANEOUS) ×2 IMPLANT
WATER STERILE IRR 1000ML POUR (IV SOLUTION) ×2 IMPLANT

## 2024-07-11 NOTE — Op Note (Signed)
 SURGERY DATE: 07/11/2024   PRE-OP DIAGNOSIS:  1. Left shoulder rotator cuff re-tear   POST-OP DIAGNOSIS:  1. Left shoulder rotator cuff re-tear   PROCEDURES:  1. Left reverse total shoulder arthroplasty 2. Left biceps tenodesis   SURGEON: Marc HILARIO Blanch, MD  ASSISTANTS: Marc Doyne, PA   ANESTHESIA: Gen + interscalene block   ESTIMATED BLOOD LOSS: 150cc   TOTAL IV FLUIDS: per anesthesia record  IMPLANTS: [employer]: RSP Glenoid Head w/Retaining screw 36N; Augmented (15 degree) Reverse Shoulder Baseplate with 6.66mm x 40mm central screw; 4 locking screws into baseplate; Standard Shell Short Humeral Stem 12 x 48mm; Neutral Socket Insert    INDICATION(S):  Marc Blair is a 68 y.o. male who previously underwent arthroscopic subscapularis repair of full-thickness, retracted tear, mini open supraspinatus and infraspinatus repair of full-thickness tear with allograft augmentation, arthroscopic biceps tenodesis, distal clavicle excision, and subacromial decompression on 12/31/2023.  He developed a rotator cuff re- tear and was unable to lift his arm over his head.  This is associated with significant pain.  Patient had elevated inflammatory markers preoperatively.  Joint aspiration was negative.  After discussion of risks, benefits, and alternatives to surgery, the patient elected to proceed with reverse shoulder arthroplasty and biceps tenodesis.   OPERATIVE FINDINGS: massive rotator cuff tear (complete supraspinatus, partial infraspinatus, complete subscapularis); severe biceps tendinopathy   OPERATIVE REPORT:   I identified Marc Blair  in the pre-operative holding area. Informed consent was obtained and the surgical site was marked. I reviewed the risks and benefits of the proposed surgical intervention and the patient wished to proceed. An interscalene block with Exparel  was administered by the Anesthesia team. The patient was transferred to the operative suite and general anesthesia  was administered. The patient was placed in the beach chair position with the head of the bed elevated approximately 45 degrees. All down side pressure points were appropriately padded. Pre-op exam under anesthesia confirmed some stiffness and crepitus. Appropriate IV antibiotics were administered. The extremity was then prepped and draped in standard fashion. A time out was performed confirming the correct extremity, correct patient, and correct procedure.   We used the standard deltopectoral incision from the coracoid to ~12cm distal. We found the cephalic vein and took it laterally. We opened the deltopectoral interval widely and placed retractors under the CA ligament in the subacromial space and under the deltoid tendon at its insertion. We then abducted and internally rotated the arm and released the underlying bursa between these retractors, taking care not to damage the circumflex branch of the axillary nerve.   Next, we brought the arm back in adduction at slight forward flexion with external rotation. We opened the clavipectoral fascia lateral to the conjoint tendon. We gently palpated the axillary nerve and verified its position and continuity on both sides of the humerus with a Tug test. This test was repeated multiple times during the procedure for nerve localization and confirmed to be intact at the end of the case. We then cauterized the anterior humeral circumflex ("Three sisters") vessels. The arm was then internally rotated, we cut the falciform ligament at approximately 1 cm of the upper portion of the pectoralis major insertion. Next we unroofed the bicipital groove. We proceeded with a soft tissue biceps tenodesis given the pathology (significant thickening and tendinopathy proximally) of the tendon.  After opening the biceps tendon sheath all the way to the supraglenoid tubercle, we performed a biceps tenodesis with two #2 TiCron sutures to the upper  border of the pectoralis major. The  proximal portion of the tendon was excised.   At this point, we could see that the supraspinatus was completely torn with a bald humeral head superiorly.  There was remnant allograft tissue present.  This along with sutures and previously placed anchors were removed. We performed a subscapularis peel using electrocautery to remove the anterior capsule and remnant of the subscapularis off of the humeral head. We released the inferior capsule from the humerus all the way to the posterior band of the inferior glenohumeral ligament. When this was complete we gently dislocated the shoulder up into the wound. We removed any osteophytes and made our cut with the appropriate inclination in 20 degrees of retroversion.  Further suture material was removed.  We then turned our attention back to the glenoid. The proximal humerus was retracted posteriorly. The anterior capsule was dissected free from the subscapularis. The anterior capsule was then excised, exposing the anterior glenoid.  Of note a total of 6 culture samples (1 swab of joint fluid + 5 specimens from about the shoulder) were sent for 21-day hold.  I then then grasped the labrum and removed it circumferentially. During the glenoid exposure, the axillary nerve was protected the entire time.    The central guidepin was drilled off of the preoperative template while maintaining the native version.  Reaming was performed such that the augment was located ~11 o'clock position. A cannulated tap was placed over the guidepin. Central hole was measured and above mentioned central screw was selected. The baseplate was inserted until appropriate contact was achieved with the glenoid. Central screw was placed and achieved excellent fixation such that the entire scapula rotated with further attempted seating of the baseplate. The peripheral screws were drilled, measured, and placed. The wound was thoroughly irrigated. The glenosphere was then placed and tightened.   We  then turned our attention back to the humerus. We sized for a standard shell prosthesis. We sequentially used larger diameter canal finders until we met appropriate resistance and sequential broaching was performed to this size listed above. Trial poly inserts were placed. The humerus was trialed and noted to have satisfactory stability, motion, and deltoid tension with above listed poly.  Subscapularis was unable to be mobilized to allow for appropriate repair so it was excised.  The trial implants were removed.  Next, the implant was placed with the appropriate retroversion. Stability was confirmed. We placed the actual poly insert. The humerus was reduced and motion, tension, and stability were satisfactory. A Hemovac drain was placed. The wound was thoroughly irrigated with pulse lavage as well as with Irrisept solution.    We again verified the tension on the axillary nerve, appropriate range of motion, and stability of the implant. We closed the deltopectoral interval deep to the cephalic vein with a running, 0-Vicryl suture. The skin was closed with 2-0 Vicryl, 4-0 Monocryl, and Dermabond. Sterile dressings were applied. A PolarCare unit and sling were placed. Patient was extubated, transferred to a stretcher bed and to the post anesthesia care unit in stable condition.   Of note, assistance from a PA was essential to performing the surgery.  PA was present for the entire surgery.  PA assisted with patient positioning, retraction, instrumentation, and wound closure. The surgery would have been more difficult and had longer operative time without PA assistance.    POSTOPERATIVE PLAN: The patient will be discharged home from the PACU.  Operative arm to remain in sling at all times  except RoM exercises and hygiene. Can perform pendulums, elbow/wrist/hand RoM exercises. Passive RoM allowed to 90 FF and 30 ER. ASA 325mg  x 6 weeks for DVT ppx. Plan for PT starting on POD #3-4. Patient to return to clinic in  ~2 weeks for post-operative appointment.

## 2024-07-11 NOTE — Progress Notes (Signed)
 Patient c/o pain in right eye rock in my eye. Flushed with saline with little to no improvement. Per patient it happens at home often. Notified Dr. Leavy of patient eye discomfort.

## 2024-07-11 NOTE — Anesthesia Postprocedure Evaluation (Signed)
 Anesthesia Post Note  Patient: Marc Blair  Procedure(s) Performed: ARTHROPLASTY, SHOULDER, TOTAL, REVERSE (Left: Shoulder)  Patient location during evaluation: PACU Anesthesia Type: General Level of consciousness: awake and alert Pain management: pain level controlled Vital Signs Assessment: post-procedure vital signs reviewed and stable Respiratory status: spontaneous breathing, nonlabored ventilation, respiratory function stable and patient connected to nasal cannula oxygen Cardiovascular status: blood pressure returned to baseline and stable Postop Assessment: no apparent nausea or vomiting Anesthetic complications: no   There were no known notable events for this encounter.   Last Vitals:  Vitals:   07/11/24 1100 07/11/24 1115  BP: 138/67 (!) 140/79  Pulse: 66 63  Resp: 11 10  Temp:    SpO2: 98% 98%    Last Pain:  Vitals:   07/11/24 1132  TempSrc:   PainSc: 5                  Debby Mines

## 2024-07-11 NOTE — TOC CM/SW Note (Signed)
 TOC was notified of need for home health at 4:21 pm. Patient has already left the hospital. CSW asked surgeon to set up home health from their office.  Lauraine Carpen, CSW [phone]

## 2024-07-11 NOTE — Progress Notes (Signed)
 Hemovac removed with no complications.  Nerve block not effective.  D/c home when pain under control.

## 2024-07-11 NOTE — Progress Notes (Signed)
 Patient blood glucose 213 upon arrival to PACU from the OR. Called Dr. Leavy and 5 units SQ Novolog  obtained and administered.  Also notified that patient is having a significant amount of severe pain at surgical site. Dilaudid  has been ordered.

## 2024-07-11 NOTE — Anesthesia Procedure Notes (Signed)
 Anesthesia Regional Block: Interscalene brachial plexus block   Pre-Anesthetic Checklist: , timeout performed,  Correct Patient, Correct Site, Correct Laterality,  Correct Procedure, Correct Position, site marked,  Risks and benefits discussed,  Surgical consent,  Pre-op evaluation,  At surgeon's request and post-op pain management  Laterality: Left  Prep: chloraprep       Needles:  Injection technique: Single-shot  Needle Type: Echogenic Needle     Needle Length: 4cm  Needle Gauge: 25     Additional Needles:   Procedures:,,,, ultrasound used (permanent image in chart),,    Narrative:  Start time: 07/11/2024 7:30 AM End time: 07/11/2024 7:32 AM Injection made incrementally with aspirations every 5 mL.  Performed by: Personally  Anesthesiologist: Leavy Ned, MD  Additional Notes: Patient's chart reviewed and they were deemed appropriate candidate for procedure, at surgeon's request. Patient educated about risks, benefits, and alternatives of the block including but not limited to: temporary or permanent nerve damage, bleeding, infection, damage to surround tissues, pneumothorax, hemidiaphragmatic paralysis, unilateral Horner's syndrome, block failure, local anesthetic toxicity. Patient expressed understanding. A formal time-out was conducted consistent with institution rules.  Monitors were applied, and minimal sedation used (see nursing record). The site was prepped with skin prep and allowed to dry, and sterile gloves were used. A high frequency linear ultrasound probe with probe cover was utilized throughout. C5-7 nerve roots located and appeared anatomically normal, local anesthetic injected around them, and echogenic block needle trajectory was monitored throughout. Aspiration performed every 5ml. Lung and blood vessels were avoided. All injections were performed without resistance and free of blood and paresthesias. The patient tolerated the procedure well.  Injectate:  20ml exparel  + 10ml 0.5% bupivacaine 

## 2024-07-11 NOTE — Evaluation (Signed)
 Occupational Therapy Evaluation Patient Details Name: Marc Blair MRN: [id number] DOB: Dec 31, 1955 Today's Date: 07/11/2024   History of Present Illness   68 yo male s/p L reverse TSA, had recent L shoulder surgery in Jan 2025 for rotator cuff repair and arthroscopic extensive debridement and subacromial decompression. PMH: CAD, CHF, DM2     Clinical Impressions Pt was seen for an OT evaluation this date. Pt lives in a one level home with his brother with 4-5 STE with bil HR. Prior to surgery, pt was active and independent. Pt has orders for LUE to be immobilized and will be NWBing per MD. Pt presents with impaired strength/ROM, and pain. These impairments result in a decreased ability to perform self care tasks requiring mod assist for LB dressing and max assist for UB dressing, application of polar care, and sling/immobilizer. Pt instructed in polar care mgt, compression stockings mgt, sling/immobilizer mgt, ROM exercises for LUE, LUE precautions, adaptive strategies for ADL performance, positioning and considerations for sleep, and home/routines modifications to maximize falls prevention, safety, and independence. Handout provided. OT adjusted sling/immobilizer and polar care to improve comfort, optimize positioning, and to maximize skin integrity/safety. Pt verbalized understanding of all education/training provided. He demo STS from recliner with CGA/SBA, but declined further ambulation d/t nausea at this time. Pt will benefit from skilled OT services to address these limitations and improve independence in daily tasks. Recommend HHOT services to continue therapy to maximize return to PLOF, address home/routines modifications and safety, minimize falls risk, and minimize caregiver burden.        If plan is discharge home, recommend the following:   A little help with walking and/or transfers;A little help with bathing/dressing/bathroom;A lot of help with bathing/dressing/bathroom;Help with  stairs or ramp for entrance;Assistance with cooking/housework     Functional Status Assessment   Patient has had a recent decline in their functional status and demonstrates the ability to make significant improvements in function in a reasonable and predictable amount of time.     Equipment Recommendations   Tub/shower seat;BSC/3in1     Recommendations for Other Services         Precautions/Restrictions         Mobility Bed Mobility               General bed mobility comments: NT in recliner pre/post session    Transfers Overall transfer level: Needs assistance   Transfers: Sit to/from Stand Sit to Stand: Supervision, Contact guard assist           General transfer comment: CGA/SBA d/t mild nausea throughout, declined further ambulation d/t his symptoms and need for nausea meds      Balance Overall balance assessment: Needs assistance Sitting-balance support: Feet supported Sitting balance-Leahy Scale: Good Sitting balance - Comments: steady within BOS   Standing balance support: No upper extremity supported Standing balance-Leahy Scale: Fair Standing balance comment: SBA for safety                           ADL either performed or assessed with clinical judgement   ADL Overall ADL's : Needs assistance/impaired                 Upper Body Dressing : Maximal assistance;Sitting   Lower Body Dressing: Moderate assistance;Sit to/from stand;Sitting/lateral leans Lower Body Dressing Details (indicate cue type and reason): don pants  Vision         Perception         Praxis         Pertinent Vitals/Pain Pain Assessment Pain Assessment: 0-10 Pain Score: 7  Pain Location: L shoulder Pain Descriptors / Indicators: Sore, Aching Pain Intervention(s): Monitored during session, Repositioned, Premedicated before session     Extremity/Trunk Assessment Upper Extremity Assessment Upper Extremity  Assessment: Right hand dominant;LUE deficits/detail LUE Deficits / Details: s/p RSA   Lower Extremity Assessment Lower Extremity Assessment: Overall WFL for tasks assessed       Communication Communication Communication: No apparent difficulties   Cognition Arousal: Alert Behavior During Therapy: WFL for tasks assessed/performed Cognition: No apparent impairments                               Following commands: Intact       Cueing  General Comments          Exercises Other Exercises Other Exercises: Edu on role of OT in acute setting, shoulder precautions, shoulder immobilizer and polar care use, and DME/AE/AD to utilize for ADL performance. Brother present for session and verbalized understanding of all education.   Shoulder Instructions      Home Living Family/patient expects to be discharged to:: Private residence Living Arrangements: Other relatives (brother lives with pt) Available Help at Discharge: Family;Available 24 hours/day Type of Home: Mobile home Home Access: Stairs to enter Entrance Stairs-Number of Steps: 5 Entrance Stairs-Rails: Right;Left Home Layout: One level     Bathroom Shower/Tub: Chief Strategy Officer: Standard     Home Equipment: Cane - single point          Prior Functioning/Environment Prior Level of Function : Independent/Modified Independent             Mobility Comments: no AD use ADLs Comments: IND with ADLs, assists with IADLs    OT Problem List: Pain;Decreased strength;Decreased range of motion   OT Treatment/Interventions: Self-care/ADL training;Therapeutic exercise;Therapeutic activities;DME and/or AE instruction;Patient/family education;Balance training      OT Goals(Current goals can be found in the care plan section)   Acute Rehab OT Goals Patient Stated Goal: return home OT Goal Formulation: With patient/family Time For Goal Achievement: 07/25/24 Potential to Achieve Goals:  Good ADL Goals Pt Will Perform Lower Body Bathing: sitting/lateral leans;sit to/from stand;with supervision;with adaptive equipment Pt Will Perform Upper Body Dressing: with min assist;sitting Pt Will Perform Lower Body Dressing: with caregiver independent in assisting;with adaptive equipment;sitting/lateral leans;sit to/from stand;with supervision   OT Frequency:  Min 2X/week    Co-evaluation              AM-PAC OT 6 Clicks Daily Activity     Outcome Measure Help from another person eating meals?: None Help from another person taking care of personal grooming?: None Help from another person toileting, which includes using toliet, bedpan, or urinal?: A Little Help from another person bathing (including washing, rinsing, drying)?: A Lot Help from another person to put on and taking off regular upper body clothing?: A Lot Help from another person to put on and taking off regular lower body clothing?: A Little 6 Click Score: 18   End of Session Nurse Communication: Mobility status  Activity Tolerance: Patient tolerated treatment well Patient left: in chair  OT Visit Diagnosis: Other abnormalities of gait and mobility (R26.89);Pain Pain - Right/Left: Left Pain - part of body: Shoulder;Arm  Time: 8680-8588 OT Time Calculation (min): 52 min Charges:  OT General Charges $OT Visit: 1 Visit OT Evaluation $OT Eval Moderate Complexity: 1 Mod OT Treatments $Self Care/Home Management : 23-37 mins Jaxyn Rout, OTR/L 07/11/24, 3:01 PM  Mathew Postiglione E Gloria Lambertson 07/11/2024, 2:59 PM

## 2024-07-11 NOTE — H&P (Signed)
 Paper H&P to be scanned into permanent record. H&P reviewed. No significant changes noted.

## 2024-07-11 NOTE — Progress Notes (Signed)
 Dr Leavy came to bedside to evaluate patient, tetracaine  drop in right eye x1 to be ordered by MD.

## 2024-07-11 NOTE — Discharge Instructions (Addendum)
 Marc Blair Blanch, MD  [facility]  Phone: [phone]  Fax: [phone]   Discharge Instructions after Reverse Shoulder Replacement    1. Activity/Sling: You are to be non-weight bearing on operative extremity. A sling/shoulder immobilizer has been provided for you. Only remove the sling to perform elbow, wrist, and hand RoM exercises and hygiene/dressing. Active reaching and lifting are not permitted. You will be given further instructions on sling use at your first physical therapy visit and postoperative visit with Dr. Blanch.   2. Dressings: Dressing may be removed at 1st physical therapy visit (~3-4 days after surgery). Afterwards, you may either leave open to air (if no drainage) or cover with dry, sterile dressing. If you have steri-strips on your wound, please do not remove them. They will fall off on their own. You may shower 5 days after surgery. Please pat incision dry. Do not rub or place any shear forces across incision. If there is drainage or any opening of incision after 5 days, please notify our offices immediately.    3. Driving:  Plan on not driving for six weeks. Please note that you are advised NOT to drive while taking narcotic pain medications as you may be impaired and unsafe to drive.   4. Medications:  - You have been provided a prescription for narcotic pain medicine (usually oxycodone ). After surgery, take 1-2 narcotic tablets every 4 hours if needed for severe pain. Please start this as soon as you begin to start having pain (if you received a nerve block, start taking as soon as this wears off).  - A prescription for anti-nausea medication will be provided in case the narcotic medicine causes nausea - take 1 tablet every 6 hours only if nauseated.  - Take enteric coated aspirin  325 mg once daily for 6 weeks to prevent blood clots. Do not take aspirin  if you have an aspirin  sensitivity/allergy or asthma or are on an anticoagulant (blood thinner) already. If so, then  your home anticoagulant will be resume and managed - do not take aspirin . -Take tylenol  1000mg  (2 Extra strength or 3 regular strength tablets) every 8 hours for pain. This will reduce the amount of narcotic medication needed. May stop tylenol  when you are having minimal pain. - Take a stool softener (Colace, Dulcolax or Senakot) if you are using narcotic pain medications to help with constipation that is associated with narcotic use. - DO NOT take ANY nonsteroidal anti-inflammatory pain medications: Advil, Motrin, Ibuprofen, Aleve, Naproxen, or Naprosyn.   If you are taking prescription medication for anxiety, depression, insomnia, muscle spasm, chronic pain, or for attention deficit disorder you are advised that you are at a higher risk of adverse effects with use of narcotics post-op, including narcotic addiction/dependence, depressed breathing, death. If you use non-prescribed substances: alcohol, marijuana, cocaine, heroin, methamphetamines, etc., you are at a higher risk of adverse effects with use of narcotics post-op, including narcotic addiction/dependence, depressed breathing, death. You are advised that taking > 50 morphine  milligram equivalents (MME) of narcotic pain medication per day results in twice the risk of overdose or death. For your prescription provided: oxycodone  5 mg - taking more than 6 tablets per day after the first few days of surgery.   5. Physical Therapy: 1-2 times per week for ~12 weeks. Therapy typically starts on post operative Day 3 or 4. You have been provided an order for physical therapy. The therapist will provide home exercises. Please contact our offices if this appointment has not been scheduled.  6. Work: May do light duty/desk job in approximately 2 weeks when off of narcotics, pain is well-controlled, and swelling has decreased if able to function with one arm in sling. Full work may take 6 weeks if light motions and function of both arms is required.  Lifting jobs may require 12 weeks.   7. Post-Op Appointments: Your first post-op appointment will be with Dr. Tobie in approximately 2 weeks time.    If you find that they have not been scheduled please call the Orthopaedic Appointment front desk at [phone].                      Bring sling from home (in bag that is unused) to the clinic when you return. Due to having another one applied after surgery.  Marc Blair Tobie, MD [facility] Phone: [phone] Fax: [phone]   REVERSE SHOULDER ARTHROPLASTY REHAB GUIDELINES   These guidelines should be tailored to individual patients based on their rehab goals, age, precautions, quality of repair, etc.  Progression should be based on patient progress and approval by the referring physician.  PHASE 1 - Day 1 through Week 2  GENERAL GUIDELINES AND PRECAUTIONS Sling wear 24/7 except during grooming and home exercises (3 to 5 times daily) Avoid shoulder extension such that the arm is posterior the frontal plane.  When patients recline, a pillow should be placed behind the upper arm and sling should be on.  They should be advised to always be able to see the elbow Avoid combined IR/ADD/EXT, such as hand behind back to prevent dislocation Avoid combined IR and ADD such as reaching across the chest to prevent dislocation No AROM No submersion in pool/water  for 4 weeks No weight bearing through operative arm (as in transfers, walker use, etc.)  GOALS Maintain integrity of joint replacement; protect soft tissue healing Increase PROM for elevation to 120 and ER to 30 (will remain the goal for first 6 weeks) Optimize distal UE circulation and muscle activity (elbow, wrist and hand) Instruct in use of sling for proper fit, polar care device for ice application after HEP, signs/symptoms of infection  EXERCISES Active elbow, wrist and hand Passive forward elevation in scapular plane to 90-120 max motion; ER in  scapular plane to 30 Active scapular retraction with arms resting in neutral position  CRITERIA TO PROGRESS TO PHASE 2 Low pain (less than 3/10) with shoulder PROM Healing of incision without signs of infection Clearance by MD to advance after 2 week MD check up  PHASE 2 -  2 weeks - 6 weeks  GENERAL GUIDELINES AND PRECAUTIONS Sling may be removed while at home; worn in community without abduction pillow May use arm for light activities of daily living (such as feeding, brushing teeth, dressing.) with elbow near  the side of the body  and arm in front of the body- no active lifting of the arm May submerge in water  (tub, pool, Jacuzzi, etc.) after 4 weeks Continue to avoid WBing through the operative arm Continue to avoid combined IR/EXT/ADD (hand behind the back) and IR/ADD  (reaching across chest) for dislocation precautions  GOALS  Achieve passive elevation to 120 and ER to 30  Low (less than 3/10) to no pain  Ability to fire all heads of the deltoid  EXERCISES May discontinue grip, and active elbow and wrist exercises since using the arm in ADL's  with sling removed around the home Continue passive elevation to 120 and ER to 30, both in  scapular plane with arm supported on table top Add submaximal isometrics, pain free effort, for all functional heads of deltoid (anterior, posterior, middle)  Ensure that with posterior deltoid isometric the shoulder does not move into extension and the arm remains anterior the frontal plane At 4 weeks:  begin to place arm in balanced position of 90 deg elevation in supine; when patient able to hold this position with ease, may begin reverse pendulums clockwise and counterclockwise  CRITERIA TO PROGRESS TO PHASE 3 Passive forward elevation in scapular plane to 120; passive ER in scapular plane to 30 Ability to fire isometrically all heads of the deltoid muscle without pain Ability to place and hold the arm in balanced position (90 deg elevation in  supine)  PHASE 3 - 6 weeks to 3 months  GENERAL GUIDELINES AND PRECAUTIONS Discontinue use of sling Avoid forcing end range motion in any direction to prevent dislocation  May advance use of the arm actively in ADL's without being restricted to arm by the side of the body, however, avoid heavy lifting and sports (forever!) May initiate functional IR behind the back gently NO UPPER BODY ERGOMETER   GOALS Optimize PROM for elevation and ER in scapular plane with realistic expectation that max  mobility for elevation is usually around 145-160 passively; ER 40 to 50 passively; functional IR to L1 Recover AROM to approach as close to PROM available as possible; may expect 135-150 deg active elevation; 30 deg active ER; active functional IR to L1 Establish dynamic stability of the shoulder with deltoid and periscapular muscle gradual strengthening  EXERCISES Forward elevation in scapular plane active progression: supine to incline, to vertical; short to long lever arm Balanced position long lever arm AROM Active ER/IR with arm at side Scapular retraction with light band resistance Functional IR with hand slide up back - very gentle and gradual NO UPPER BODY ERGOMETER     CRITERIA TO PROGRESS TO PHASE 4  AROM equals/approaches PROM with good mechanics for elevation   No pain  Higher level demand on shoulder than ADL functions   PHASE 4 12 months and beyond  GENERAL GUIDELINES AND PRECAUTIONS No heavy lifting and no overhead sports No heavy pushing activity Gradually increase strength of deltoid and scapular stabilizers; also the rotator cuff if present with weights not to exceed 5 lbs NO UPPER BODY ERGOMETER   GOALS  Optimize functional use of the operative UE to meet the desired demands  Gradual increase in deltoid, scapular muscle, and rotator cuff strength  Pain free functional activities   EXERCISES Add light hand weights for deltoid up to and not to exceed 3 lbs for  anterior and posterior with long arm lift against gravity; elbow bent to 90 deg for abduction in scapular plane Theraband progression for extension to hip with scapular depression/retraction Theraband progression for serratus anterior punches in supine; avoid wall, incline or prone pressups for serratus anterior End range stretching gently without forceful overpressure in all planes (elevation in scapular plane, ER in scapular plane, functional IR) with stretching done for life as part of a daily routine NO UPPER BODY ERGOMETER     CRITERIA FOR DISCHARGE FROM SKILLED PHYSICAL THERAPY  Pain free AROM for shoulder elevation (expect around 135-150)  Functional strength for all ADL's, work tasks, and hobbies approved by Careers adviser  Independence with home maintenance program   NOTES: 1. With proper exercise, motion, strength, and function continue to improve even after one year. 2. The complication rate after surgery  is 5 - 8%. Complications include infection, fracture, heterotopic bone formation, nerve injury, instability, rotator cuff tear, and tuberosity nonunion. Please look for clinical signs, unusual symptoms, or lack of progress with therapy and report those to Dr. Tobie. Prefer more communication than less.  3. The therapy plan above only serves as a guide. Please be aware of specific individualized patient instructions as written on the prescription or through discussions with the surgeon. 4. Please call Dr. Tobie if you have any specific questions or concerns [phone]   SHOULDER SLING IMMOBILIZER   VIDEO Slingshot 2 Shoulder Brace Application - YouTube ---[email or web address].info/  INSTRUCTIONS While supporting the injured arm, slide the forearm into the sling. Wrap the adjustable shoulder strap around the neck and shoulders and attach the strap end to the sling using  the "alligator strap tab."  Adjust the shoulder strap to the required length. Position the  shoulder pad behind the neck. To secure the shoulder pad location (optional), pull the shoulder strap away from the shoulder pad, unfold the hook material on the top of the pad, then press the shoulder strap back onto the hook material to secure the pad in place. Attach the closure strap across the open top of the sling. Position the strap so that it holds the arm securely in the sling. Next, attach the thumb strap to the open end of the sling between the thumb and fingers. After sling has been fit, it may be easily removed and reapplied using the quick release buckle on shoulder strap. If a neutral pillow or 15 abduction pillow is included, place the pillow at the waistline. Attach the sling to the pillow, lining up hook material on the pillow with the loop on sling. Adjust the waist strap to fit.  If waist strap is too long, cut it to fit. Use the small piece of double sided hook material (located on top of the pillow) to secure the strap end. Place the double sided hook material on the inside of the cut strap end and secure it to the waist strap.     If no pillow is included, attach the waist strap to the sling and adjust to fit.    Washing Instructions: Straps and sling must be removed and cleaned regularly depending on your activity level and perspiration. Hand wash straps and sling in cold water  with mild detergent, rinse, air dry   POLAR CARE INFORMATION  MassAdvertisement.it  How to use [facility] Polar Care [facility] Therapy System?  YouTube   ShippingScam.co.uk  OPERATING INSTRUCTIONS  Start the product With dry hands, connect the transformer to the electrical connection located on the top of the cooler. Next, plug the transformer into an appropriate electrical outlet. The unit will automatically start running at this point.  To stop the pump, disconnect electrical power.  Unplug to stop the product when not in use. Unplugging the Polar Care unit turns it off. Always  unplug immediately after use. Never leave it plugged in while unattended. Remove pad.    FIRST ADD WATER  TO FILL LINE, THEN ICE---Replace ice when existing ice is almost melted  1 Discuss Treatment with your Licensed Health Care Practitioner and Use Only as Prescribed 2 Apply Insulation Barrier & Cold Therapy Pad 3 Check for Moisture 4 Inspect Skin Regularly  Tips and Trouble Shooting Usage Tips 1. Use cubed or chunked ice for optimal performance. 2. It is recommended to drain the Pad between uses. To drain the pad, hold the Pad upright with the hose  pointed toward the ground. Depress the black plunger and allow water  to drain out. 3. You may disconnect the Pad from the unit without removing the pad from the affected area by depressing the silver tabs on the hose coupling and gently pulling the hoses apart. The Pad and unit will seal itself and will not leak. Note: Some dripping during release is normal. 4. DO NOT RUN PUMP WITHOUT WATER ! The pump in this unit is designed to run with water . Running the unit without water  will cause permanent damage to the pump. 5. Unplug unit before removing lid.  TROUBLESHOOTING GUIDE Pump not running, Water  not flowing to the pad, Pad is not getting cold 1. Make sure the transformer is plugged into the wall outlet. 2. Confirm that the ice and water  are filled to the indicated levels. 3. Make sure there are no kinks in the pad. 4. Gently pull on the blue tube to make sure the tube/pad junction is straight. 5. Remove the pad from the treatment site and ll it while the pad is lying at; then reapply. 6. Confirm that the pad couplings are securely attached to the unit. Listen for the double clicks (Figure 1) to confirm the pad couplings are securely attached.  Leaks    Note: Some condensation on the lines, controller, and pads is unavoidable, especially in warmer climates. 1. If using a [employer] Polar Care Cold Therapy unit with a detachable Cold Therapy Pad, and a  leak exists (other than condensation on the lines) disconnect the pad couplings. Make sure the silver tabs on the couplings are depressed before reconnecting the pad to the pump hose; then confirm both sides of the coupling are properly clicked in. 2. If the coupling continues to leak or a leak is detected in the pad itself, stop using it and call [employer] Customer Care at [phone].  Cleaning After use, empty and dry the unit with a soft cloth. Warm water  and mild detergent may be used occasionally to clean the pump and tubes.  WARNING: The Polar Care Cube can be cold enough to cause serious injury, including full skin necrosis. Follow these Operating Instructions, and carefully read the Product Insert (see pouch on side of unit) and the Cold Therapy Pad Fitting Instructions (provided with each Cold Therapy Pad) prior to use.   Information for Discharge Teaching: EXPAREL  (bupivacaine  liposome injectable suspension)   Pain relief is important to your recovery. The goal is to control your pain so you can move easier and return to your normal activities as soon as possible after your procedure. Your physician may use several types of medicines to manage pain, swelling, and more.  Your surgeon or anesthesiologist gave you EXPAREL (bupivacaine ) to help control your pain after surgery.  EXPAREL  is a local anesthetic designed to release slowly over an extended period of time to provide pain relief by numbing the tissue around the surgical site. EXPAREL  is designed to release pain medication over time and can control pain for up to 72 hours. Depending on how you respond to EXPAREL , you may require less pain medication during your recovery. EXPAREL  can help reduce or eliminate the need for opioids during the first few days after surgery when pain relief is needed the most. EXPAREL  is not an opioid and is not addictive. It does not cause sleepiness or sedation.   Important! A teal colored band has been  placed on your arm with the date, time and amount of EXPAREL  you have received. Please leave  this armband in place for the full 96 hours following administration, and then you may remove the band. If you return to the hospital for any reason within 96 hours following the administration of EXPAREL , the armband provides important information that your health care providers to know, and alerts them that you have received this anesthetic.    Possible side effects of EXPAREL : Temporary loss of sensation or ability to move in the area where medication was injected. Nausea, vomiting, constipation Rarely, numbness and tingling in your mouth or lips, lightheadedness, or anxiety may occur. Call your doctor right away if you think you may be experiencing any of these sensations, or if you have other questions regarding possible side effects.  Follow all other discharge instructions given to you by your surgeon or nurse. Eat a healthy diet and drink plenty of water  or other fluids.

## 2024-07-11 NOTE — Anesthesia Procedure Notes (Signed)
 Procedure Name: Intubation Date/Time: 07/11/2024 8:02 AM  Performed by: Norleen Alberta HERO., CRNAPre-anesthesia Checklist: Patient identified, Patient being monitored, Timeout performed, Emergency Drugs available and Suction available Patient Re-evaluated:Patient Re-evaluated prior to induction Oxygen Delivery Method: Circle system utilized Preoxygenation: Pre-oxygenation with 100% oxygen Induction Type: IV induction Ventilation: Mask ventilation without difficulty Laryngoscope Size: 3 and McGrath Grade View: Grade I Tube type: Oral Tube size: 7.5 mm Number of attempts: 1 Airway Equipment and Method: Stylet Placement Confirmation: ETT inserted through vocal cords under direct vision, positive ETCO2 and breath sounds checked- equal and bilateral Secured at: 21 cm Tube secured with: Tape Dental Injury: Teeth and Oropharynx as per pre-operative assessment

## 2024-07-11 NOTE — Transfer of Care (Signed)
 Immediate Anesthesia Transfer of Care Note  Patient: Marc Blair  Procedure(s) Performed: ARTHROPLASTY, SHOULDER, TOTAL, REVERSE (Left: Shoulder)  Patient Location: PACU  Anesthesia Type:General  Level of Consciousness: awake and patient cooperative  Airway & Oxygen Therapy: Patient Spontanous Breathing  Post-op Assessment: Report given to RN and Post -op Vital signs reviewed and stable  Post vital signs: stable  Last Vitals:  Vitals Value Taken Time  BP 123/72 07/11/24 10:10  Temp    Pulse 62 07/11/24 10:12  Resp 14 07/11/24 10:12  SpO2 97 % 07/11/24 10:12  Vitals shown include unfiled device data.  Last Pain:  Vitals:   07/11/24 0610  TempSrc: Temporal  PainSc: 2          Complications: No notable events documented.

## 2024-07-11 NOTE — Anesthesia Preprocedure Evaluation (Signed)
 Anesthesia Evaluation  Patient identified by MRN, date of birth, ID band Patient awake    Reviewed: Allergy & Precautions, H&P , NPO status , Patient's Chart, lab work & pertinent test results, reviewed documented beta blocker date and time   History of Anesthesia Complications Negative for: history of anesthetic complications  Airway Mallampati: III  TM Distance: >3 FB Neck ROM: full    Dental  (+) Dental Advidsory Given, Partial Upper, Partial Lower   Pulmonary neg shortness of breath, sleep apnea and Continuous Positive Airway Pressure Ventilation , neg COPD, neg recent URI, former smoker   Pulmonary exam normal breath sounds clear to auscultation       Cardiovascular Exercise Tolerance: Good hypertension, + angina (stable)  + CAD, + Past MI, + Cardiac Stents and +CHF  Normal cardiovascular exam(-) dysrhythmias (-) Valvular Problems/Murmurs Rhythm:regular Rate:Normal     Neuro/Psych negative neurological ROS  negative psych ROS   GI/Hepatic Neg liver ROS, PUD,GERD  ,,  Endo/Other  diabetes    Renal/GU negative Renal ROS  negative genitourinary   Musculoskeletal   Abdominal   Peds  Hematology negative hematology ROS (+)   Anesthesia Other Findings Past Medical History: No date: CHF (congestive heart failure) (HCC) No date: Coronary artery disease No date: Diabetes mellitus without complication (HCC) No date: GERD (gastroesophageal reflux disease) No date: H/O angioplasty No date: High cholesterol No date: Hypertension 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, 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 No date: Obesity No date: Sleep apnea Dec 2016: Stented coronary artery   Reproductive/Obstetrics negative OB ROS                              Anesthesia Physical Anesthesia Plan  ASA: 3  Anesthesia Plan: General   Post-op Pain Management: Regional block*   Induction: Intravenous  PONV Risk Score and Plan: 2 and  Ondansetron , Dexamethasone , Treatment may vary due to age or medical condition and Midazolam   Airway Management Planned: Oral ETT  Additional Equipment:   Intra-op Plan:   Post-operative Plan: Extubation in OR  Informed Consent: I have reviewed the patients History and Physical, chart, labs and discussed the procedure including the risks, benefits and alternatives for the proposed anesthesia with the patient or authorized representative who has indicated his/her understanding and acceptance.     Dental Advisory Given  Plan Discussed with: Anesthesiologist, CRNA and Surgeon  Anesthesia Plan Comments: (Patient consented for risks of anesthesia including but not limited to:  - adverse reactions to medications - damage to eyes, teeth, lips or other oral mucosa - nerve damage due to positioning  - sore throat or hoarseness - Damage to heart, brain, nerves, lungs, other parts of body or loss of life  Patient voiced understanding and assent.)         Anesthesia Quick Evaluation

## 2024-07-11 NOTE — Evaluation (Signed)
 Physical Therapy Evaluation Patient Details Name: ADRYEL WORTMANN MRN: [id number] DOB: 11/07/1956 Today's Date: 07/11/2024  History of Present Illness  68 yo male s/p L reverse TSA, had recent L shoulder surgery in Jan 2025 for rotator cuff repair and arthroscopic extensive debridement and subacromial decompression. PMH: CAD, CHF, DM2  Clinical Impression  Pt is a pleasant 68 year old male who was admitted for s/p L reverse TSA. Pt performs STS transfer with supervision assist, no AD, not experiencing any LOB. Ambulation x80' with no AD, pt demo step through pattern with decreased step length and width. Pt demonstrates deficits with shoulder ROM and strength, impacting his current QoL. Pt would benefit from skilled PT interventions to meet therapy goals and improve QoL. PT to follow acutely as appropriate.          If plan is discharge home, recommend the following: A little help with walking and/or transfers;A little help with bathing/dressing/bathroom;Assist for transportation;Help with stairs or ramp for entrance   Can travel by private vehicle        Equipment Recommendations None recommended by PT  Recommendations for Other Services       Functional Status Assessment Patient has had a recent decline in their functional status and demonstrates the ability to make significant improvements in function in a reasonable and predictable amount of time.     Precautions / Restrictions Precautions Precautions: Shoulder;Fall Shoulder Interventions: Shoulder sling/immobilizer Precaution Booklet Issued: Yes (comment) Recall of Precautions/Restrictions: Intact Required Braces or Orthoses: Sling Restrictions Weight Bearing Restrictions Per Provider Order: No      Mobility  Bed Mobility               General bed mobility comments: found in recliner    Transfers Overall transfer level: Needs assistance Equipment used: None Transfers: Sit to/from Stand Sit to Stand:  Supervision           General transfer comment: supervision assist with STS, no AD, no LOB    Ambulation/Gait Ambulation/Gait assistance: Contact guard assist Gait Distance (Feet): 80 Feet Assistive device: None Gait Pattern/deviations: Step-through pattern, Decreased step length - right, Decreased step length - left       General Gait Details: step through pattern, decreased step length and width  Stairs Stairs: Yes Stairs assistance: Contact guard assist Stair Management: One rail Right Number of Stairs: 4 General stair comments: reciprocal pattern, right rail, no LOB  Wheelchair Mobility     Tilt Bed    Modified Rankin (Stroke Patients Only)       Balance Overall balance assessment: Needs assistance Sitting-balance support: Feet supported Sitting balance-Leahy Scale: Good Sitting balance - Comments: steady within BOS   Standing balance support: No upper extremity supported Standing balance-Leahy Scale: Fair Standing balance comment: SBA for safety                             Pertinent Vitals/Pain Pain Assessment Pain Assessment: No/denies pain    Home Living Family/patient expects to be discharged to:: Private residence Living Arrangements: Other relatives Available Help at Discharge: Family;Available 24 hours/day Type of Home: Mobile home Home Access: Stairs to enter Entrance Stairs-Rails: Doctor, general practice of Steps: 5   Home Layout: One level Home Equipment: Cane - single point Additional Comments: SPC    Prior Function Prior Level of Function : Independent/Modified Independent             Mobility Comments: no AD use ADLs  Comments: IND with ADLs, assists with IADLs     Extremity/Trunk Assessment   Upper Extremity Assessment Upper Extremity Assessment: Defer to OT evaluation LUE Deficits / Details: s/p RSA    Lower Extremity Assessment Lower Extremity Assessment: Overall WFL for tasks assessed     Cervical / Trunk Assessment Cervical / Trunk Assessment: Normal  Communication   Communication Communication: No apparent difficulties    Cognition Arousal: Alert Behavior During Therapy: WFL for tasks assessed/performed   PT - Cognitive impairments: No apparent impairments                         Following commands: Intact       Cueing Cueing Techniques: Verbal cues     General Comments      Exercises     Assessment/Plan    PT Assessment Patient needs continued PT services  PT Problem List Decreased strength;Decreased range of motion;Decreased activity tolerance;Decreased balance;Decreased mobility;Decreased safety awareness       PT Treatment Interventions Gait training;Stair training;Functional mobility training;Therapeutic activities;Therapeutic exercise;DME instruction;Balance training;Patient/family education    PT Goals (Current goals can be found in the Care Plan section)  Acute Rehab PT Goals Patient Stated Goal: to reduce pain and get stronger PT Goal Formulation: With patient Time For Goal Achievement: 07/25/24 Potential to Achieve Goals: Good    Frequency Min 1X/week     Co-evaluation               AM-PAC PT 6 Clicks Mobility  Outcome Measure Help needed turning from your back to your side while in a flat bed without using bedrails?: A Little Help needed moving from lying on your back to sitting on the side of a flat bed without using bedrails?: A Little Help needed moving to and from a bed to a chair (including a wheelchair)?: A Little Help needed standing up from a chair using your arms (e.g., wheelchair or bedside chair)?: A Little Help needed to walk in hospital room?: A Little Help needed climbing 3-5 steps with a railing? : A Little 6 Click Score: 18    End of Session   Activity Tolerance: Patient tolerated treatment well Patient left: in chair;with call bell/phone within reach;with family/visitor present Nurse  Communication: Mobility status PT Visit Diagnosis: Unsteadiness on feet (R26.81);Muscle weakness (generalized) (M62.81)    Time: 8546-8495 PT Time Calculation (min) (ACUTE ONLY): 11 min   Charges:                Armanie Ullmer Romero-Perozo, SPT  07/11/2024, 3:18 PM

## 2024-07-12 ENCOUNTER — Encounter: Payer: Self-pay | Admitting: Orthopedic Surgery

## 2024-08-01 LAB — AEROBIC/ANAEROBIC CULTURE W GRAM STAIN (SURGICAL/DEEP WOUND)
Culture: NO GROWTH
Culture: NO GROWTH
Culture: NO GROWTH
Culture: NO GROWTH
Culture: NO GROWTH
Culture: NO GROWTH
Gram Stain: NONE SEEN
Gram Stain: NONE SEEN
Gram Stain: NONE SEEN
Gram Stain: NONE SEEN
Gram Stain: NONE SEEN

## 2024-11-02 ENCOUNTER — Ambulatory Visit
Admission: RE | Admit: 2024-11-02 | Discharge: 2024-11-02 | Disposition: A | Discharge status: Home / Self Care | Source: Ambulatory Visit | Attending: Acute Care | Admitting: Acute Care

## 2024-11-02 DIAGNOSIS — Z122 Encounter for screening for malignant neoplasm of respiratory organs: Secondary | ICD-10-CM | POA: Insufficient documentation

## 2024-11-02 DIAGNOSIS — Z87891 Personal history of nicotine dependence: Secondary | ICD-10-CM | POA: Insufficient documentation

## 2024-11-08 ENCOUNTER — Other Ambulatory Visit: Payer: Self-pay

## 2024-11-08 DIAGNOSIS — Z122 Encounter for screening for malignant neoplasm of respiratory organs: Secondary | ICD-10-CM

## 2024-11-08 DIAGNOSIS — Z87891 Personal history of nicotine dependence: Secondary | ICD-10-CM

## 2024-11-14 ENCOUNTER — Other Ambulatory Visit: Payer: Self-pay | Admitting: Orthopedic Surgery

## 2024-11-14 DIAGNOSIS — M542 Cervicalgia: Secondary | ICD-10-CM

## 2024-11-18 ENCOUNTER — Ambulatory Visit
Admission: RE | Admit: 2024-11-18 | Discharge: 2024-11-18 | Disposition: A | Discharge status: Home / Self Care | Source: Ambulatory Visit | Attending: Orthopedic Surgery | Admitting: Orthopedic Surgery

## 2024-11-18 DIAGNOSIS — M542 Cervicalgia: Secondary | ICD-10-CM | POA: Diagnosis present
